# Patient Record
Sex: Female | Born: 1940 | Race: White | Hispanic: No | Marital: Married | Smoking: Former smoker
Health system: Southern US, Community
[De-identification: ages and names within clinical notes are randomized; demographics above are authoritative.]

## PROBLEM LIST (undated history)

## (undated) DIAGNOSIS — M25512 Pain in left shoulder: Secondary | ICD-10-CM

## (undated) DIAGNOSIS — R001 Bradycardia, unspecified: Secondary | ICD-10-CM

## (undated) DIAGNOSIS — G459 Transient cerebral ischemic attack, unspecified: Secondary | ICD-10-CM

## (undated) DIAGNOSIS — E782 Mixed hyperlipidemia: Secondary | ICD-10-CM

## (undated) DIAGNOSIS — I779 Disorder of arteries and arterioles, unspecified: Secondary | ICD-10-CM

## (undated) DIAGNOSIS — I2510 Atherosclerotic heart disease of native coronary artery without angina pectoris: Secondary | ICD-10-CM

## (undated) DIAGNOSIS — I739 Peripheral vascular disease, unspecified: Secondary | ICD-10-CM

## (undated) DIAGNOSIS — I10 Essential (primary) hypertension: Secondary | ICD-10-CM

## (undated) DIAGNOSIS — I701 Atherosclerosis of renal artery: Secondary | ICD-10-CM

## (undated) HISTORY — DX: Bradycardia, unspecified: R00.1

## (undated) HISTORY — DX: Disorder of arteries and arterioles, unspecified: I77.9

## (undated) HISTORY — PX: CARDIAC CATHETERIZATION: SHX172

## (undated) HISTORY — DX: Peripheral vascular disease, unspecified: I73.9

## (undated) HISTORY — DX: Atherosclerotic heart disease of native coronary artery without angina pectoris: I25.10

## (undated) HISTORY — DX: Transient cerebral ischemic attack, unspecified: G45.9

## (undated) HISTORY — DX: Mixed hyperlipidemia: E78.2

## (undated) HISTORY — DX: Pain in left shoulder: M25.512

## (undated) HISTORY — PX: KNEE SURGERY: SHX244

## (undated) HISTORY — DX: Essential (primary) hypertension: I10

## (undated) HISTORY — PX: TONSILLECTOMY AND ADENOIDECTOMY: SUR1326

## (undated) HISTORY — PX: CORONARY ANGIOPLASTY WITH STENT PLACEMENT: SHX49

## (undated) HISTORY — DX: Atherosclerosis of renal artery: I70.1

---
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
## 1962-04-26 HISTORY — PX: DILATION AND CURETTAGE OF UTERUS: SHX78

## 2001-03-26 HISTORY — PX: CORONARY ARTERY BYPASS GRAFT: SHX141

## 2001-03-31 ENCOUNTER — Inpatient Hospital Stay (HOSPITAL_COMMUNITY): Admission: AD | Admit: 2001-03-31 | Discharge: 2001-04-14 | Payer: Self-pay | Admitting: Cardiovascular Disease

## 2001-04-04 ENCOUNTER — Encounter: Payer: Self-pay | Admitting: Cardiothoracic Surgery

## 2001-04-06 ENCOUNTER — Encounter: Payer: Self-pay | Admitting: Cardiothoracic Surgery

## 2001-04-07 ENCOUNTER — Encounter: Payer: Self-pay | Admitting: Cardiothoracic Surgery

## 2001-04-08 ENCOUNTER — Encounter: Payer: Self-pay | Admitting: Cardiothoracic Surgery

## 2001-04-09 ENCOUNTER — Encounter: Payer: Self-pay | Admitting: Cardiothoracic Surgery

## 2001-04-10 ENCOUNTER — Encounter: Payer: Self-pay | Admitting: Cardiothoracic Surgery

## 2001-04-11 ENCOUNTER — Encounter: Payer: Self-pay | Admitting: Cardiothoracic Surgery

## 2001-04-12 ENCOUNTER — Encounter: Payer: Self-pay | Admitting: Cardiothoracic Surgery

## 2001-12-30 ENCOUNTER — Encounter: Payer: Self-pay | Admitting: Cardiology

## 2001-12-30 ENCOUNTER — Inpatient Hospital Stay (HOSPITAL_COMMUNITY): Admission: AD | Admit: 2001-12-30 | Discharge: 2002-01-04 | Payer: Self-pay | Admitting: Cardiology

## 2002-03-27 ENCOUNTER — Inpatient Hospital Stay (HOSPITAL_COMMUNITY): Admission: AD | Admit: 2002-03-27 | Discharge: 2002-03-30 | Payer: Self-pay | Admitting: Cardiology

## 2004-06-15 ENCOUNTER — Ambulatory Visit: Payer: Self-pay | Admitting: Cardiology

## 2004-06-16 ENCOUNTER — Ambulatory Visit: Payer: Self-pay | Admitting: Cardiology

## 2004-07-06 ENCOUNTER — Ambulatory Visit: Payer: Self-pay | Admitting: Cardiology

## 2004-08-13 ENCOUNTER — Ambulatory Visit: Payer: Self-pay | Admitting: Cardiology

## 2004-09-26 ENCOUNTER — Ambulatory Visit: Payer: Self-pay | Admitting: Internal Medicine

## 2004-09-27 ENCOUNTER — Observation Stay (HOSPITAL_COMMUNITY): Admission: EM | Admit: 2004-09-27 | Discharge: 2004-09-27 | Payer: Self-pay | Admitting: Emergency Medicine

## 2004-09-29 ENCOUNTER — Ambulatory Visit: Payer: Self-pay

## 2004-11-02 ENCOUNTER — Ambulatory Visit: Payer: Self-pay | Admitting: Cardiology

## 2005-06-15 ENCOUNTER — Ambulatory Visit: Payer: Self-pay | Admitting: Cardiology

## 2005-07-09 ENCOUNTER — Ambulatory Visit: Payer: Self-pay | Admitting: Cardiology

## 2005-09-14 ENCOUNTER — Ambulatory Visit: Payer: Self-pay | Admitting: Cardiology

## 2006-02-11 ENCOUNTER — Ambulatory Visit: Payer: Self-pay | Admitting: Cardiology

## 2006-02-14 ENCOUNTER — Ambulatory Visit: Payer: Self-pay | Admitting: Cardiology

## 2006-06-08 ENCOUNTER — Ambulatory Visit: Payer: Self-pay | Admitting: Cardiology

## 2006-06-16 ENCOUNTER — Ambulatory Visit: Payer: Self-pay | Admitting: Cardiology

## 2006-06-17 ENCOUNTER — Inpatient Hospital Stay (HOSPITAL_BASED_OUTPATIENT_CLINIC_OR_DEPARTMENT_OTHER): Admission: RE | Admit: 2006-06-17 | Discharge: 2006-06-17 | Payer: Self-pay | Admitting: Cardiology

## 2006-06-17 ENCOUNTER — Ambulatory Visit: Payer: Self-pay | Admitting: Cardiology

## 2006-07-04 ENCOUNTER — Ambulatory Visit: Payer: Self-pay | Admitting: Cardiology

## 2006-07-25 ENCOUNTER — Ambulatory Visit: Payer: Self-pay | Admitting: Cardiology

## 2006-09-20 ENCOUNTER — Encounter: Payer: Self-pay | Admitting: Cardiology

## 2006-09-20 ENCOUNTER — Ambulatory Visit: Payer: Self-pay | Admitting: Cardiology

## 2006-09-21 ENCOUNTER — Ambulatory Visit (HOSPITAL_COMMUNITY): Admission: RE | Admit: 2006-09-21 | Discharge: 2006-09-21 | Payer: Self-pay | Admitting: Cardiology

## 2006-09-21 ENCOUNTER — Ambulatory Visit: Payer: Self-pay | Admitting: Cardiology

## 2006-10-17 ENCOUNTER — Ambulatory Visit: Payer: Self-pay | Admitting: Physician Assistant

## 2007-01-13 ENCOUNTER — Encounter: Payer: Self-pay | Admitting: Cardiology

## 2007-03-03 ENCOUNTER — Ambulatory Visit: Payer: Self-pay | Admitting: Cardiology

## 2007-04-25 ENCOUNTER — Encounter: Payer: Self-pay | Admitting: Physician Assistant

## 2007-07-31 ENCOUNTER — Encounter: Payer: Self-pay | Admitting: Cardiology

## 2007-07-31 ENCOUNTER — Ambulatory Visit: Payer: Self-pay | Admitting: Cardiology

## 2007-08-25 ENCOUNTER — Encounter: Payer: Self-pay | Admitting: Physician Assistant

## 2007-09-05 ENCOUNTER — Ambulatory Visit: Payer: Self-pay | Admitting: Cardiology

## 2007-10-10 ENCOUNTER — Ambulatory Visit: Payer: Self-pay | Admitting: Cardiology

## 2007-11-28 ENCOUNTER — Ambulatory Visit: Payer: Self-pay | Admitting: Cardiology

## 2008-08-05 ENCOUNTER — Ambulatory Visit: Payer: Self-pay | Admitting: Cardiology

## 2008-08-06 ENCOUNTER — Encounter: Payer: Self-pay | Admitting: Cardiology

## 2008-08-06 ENCOUNTER — Ambulatory Visit: Payer: Self-pay | Admitting: Cardiology

## 2009-02-13 ENCOUNTER — Encounter: Payer: Self-pay | Admitting: Cardiology

## 2009-03-03 ENCOUNTER — Encounter: Payer: Self-pay | Admitting: Cardiology

## 2009-03-27 ENCOUNTER — Encounter (INDEPENDENT_AMBULATORY_CARE_PROVIDER_SITE_OTHER): Payer: Self-pay | Admitting: *Deleted

## 2009-03-28 ENCOUNTER — Ambulatory Visit: Payer: Self-pay | Admitting: Cardiology

## 2009-09-29 ENCOUNTER — Encounter: Payer: Self-pay | Admitting: Cardiology

## 2009-10-14 ENCOUNTER — Ambulatory Visit: Payer: Self-pay | Admitting: Cardiology

## 2009-11-24 ENCOUNTER — Encounter: Payer: Self-pay | Admitting: Cardiology

## 2010-01-19 ENCOUNTER — Telehealth (INDEPENDENT_AMBULATORY_CARE_PROVIDER_SITE_OTHER): Payer: Self-pay | Admitting: *Deleted

## 2010-01-23 ENCOUNTER — Telehealth (INDEPENDENT_AMBULATORY_CARE_PROVIDER_SITE_OTHER): Payer: Self-pay | Admitting: *Deleted

## 2010-01-27 ENCOUNTER — Encounter: Payer: Self-pay | Admitting: Cardiology

## 2010-01-28 ENCOUNTER — Encounter: Payer: Self-pay | Admitting: Cardiology

## 2010-03-16 ENCOUNTER — Encounter: Payer: Self-pay | Admitting: Cardiology

## 2010-03-23 ENCOUNTER — Encounter: Payer: Self-pay | Admitting: Cardiology

## 2010-04-01 ENCOUNTER — Ambulatory Visit: Payer: Self-pay | Admitting: Cardiology

## 2010-04-01 ENCOUNTER — Encounter: Payer: Self-pay | Admitting: Cardiology

## 2010-04-26 HISTORY — PX: ILIAC ARTERY STENT: SHX1786

## 2010-05-26 NOTE — Assessment & Plan Note (Signed)
Summary: 6 mof u per dec reminder   Visit Type:  Follow-up Primary Provider:  Beatrix Fetters Shah,MD  CC:  CAD.  History of Present Illness: Patient is seen for cardiology followup.  She feels great.  I saw her 6 months ago.  She has documented coronary disease.  She underwent CABG in the past.  Her last catheter was done in May, 2008.  LIMA was atretic.  There was question of 60% left main.  If she has symptoms we will recatheterize the left main.  She had a Cardiolite in April, 2010.  There was no ischemia.  Ejection fraction is normal.  Preventive Screening-Counseling & Management  Alcohol-Tobacco     Smoking Status: quit     Year Quit: 2002  Current Medications (verified): 1)  Plavix 75 Mg Tabs (Clopidogrel Bisulfate) .... Take 1 Tablet By Mouth Once A Day 2)  Aspirin 81 Mg Tbec (Aspirin) .... Take One Tablet By Mouth Daily 3)  Fish Oil 1000 Mg Caps (Omega-3 Fatty Acids) .... Take 2 Capsules By Mouth Two Times A Day 4)  Amlodipine Besylate 5 Mg Tabs (Amlodipine Besylate) .... Take One Tablet By Mouth Daily 5)  Isosorbide Mononitrate Cr 60 Mg Xr24h-Tab (Isosorbide Mononitrate) .... Take 1 Tablet By Mouth Once A Day 6)  Neurontin 100 Mg Caps (Gabapentin) .... Take 1 Tab By Mouth At Bedtime 7)  Metoprolol Succinate 50 Mg Xr24h-Tab (Metoprolol Succinate) .... Take 1&1/2 Tablet By Mouth Daily 8)  Imipramine Hcl 25 Mg Tabs (Imipramine Hcl) .... Take 1 Tablet By Mouth Once A Day 9)  Nitrostat 0.4 Mg Subl (Nitroglycerin) .... Use As Directed 10)  Vitamin D 1000 Unit Tabs (Cholecalciferol) .... Take 1 Tablet By Mouth Once A Day 11)  Lipitor 20 Mg Tabs (Atorvastatin Calcium) .... Take One Tablet By Mouth Daily.  Allergies (verified): 1)  Sulfa  Comments:  Nurse/Medical Assistant: The patient's medication list and allergies were reviewed with the patient and were updated in the Medication and Allergy Lists.  Past History:  Past Medical History: HYPERTENSION, UNSPECIFIED  (ICD-401.9) HYPERLIPIDEMIA-MIXED (ICD-272.4) CABG CAD...cath May, 2008.Marland KitchenMarland KitchenLIMA atretic.Marland Kitchen60% L Main.Marland KitchenMarland KitchenIf problems, recath with IVUS to assess L Main  /  Cardiolite  April, 2010...no ischemia EF 70%...nuclear scan...  07/2008 Bradycardia...sinus...assymptomatic PAD COPD TIA...Marland Kitchenremote..    Review of Systems       Patient denies fever, chills, headache, sweats, rash, change in vision, change in hearing, chest pain, cough, nausea vomiting, urinary symptoms.  All other systems are reviewed and are negative.  Vital Signs:  Patient profile:   70 year old female Height:      61.5 inches Weight:      145 pounds BMI:     27.05 Pulse rate:   66 / minute BP sitting:   125 / 80  (left arm) Cuff size:   regular  Vitals Entered By: Carlye Grippe (April 01, 2010 10:20 AM)  Nutrition Counseling: Patient's BMI is greater than 25 and therefore counseled on weight management options.  Physical Exam  General:  patient looks great. Head:  head is atraumatic. Eyes:  no xanthelasma. Neck:  no jugular venous distention. Chest Wall:  no chest wall tenderness. Lungs:  lungs are clear cardiorespiratory effort is nonlabored. Heart:  cardiac exam reveals S1-S2.  No clicks or significant murmurs. Abdomen:  abdomen soft. Msk:  no musculoskeletal deformities. Extremities:  no peripheral edema. Skin:  no skin rashes. Psych:  patient is oriented to person time and place.  Affect is normal.   Impression &  Recommendations:  Problem # 1:  TIA (ICD-435.9) The patient has had no symptoms of a TIA.  No further workup.  Problem # 2:  BRADYCARDIA (ICD-427.89) Heart rate today is 64.  She's not having any symptoms of bradycardia.  No further workup.  Problem # 3:  CAD (ICD-414.00) Coronary disease is stable.  EKG is done today and reviewed by me.  She has old inverted T waves in V1 and V2.  There is no significant change.  No further workup needed.  We may consider stress test in 6 months and one year.   It is documented that she does have left main disease.  Problem # 4:  HYPERTENSION, UNSPECIFIED (ICD-401.9) Blood pressure is well-controlled.  No change in therapy.  Problem # 5:  HYPERLIPIDEMIA-MIXED (ICD-272.4)  Her updated medication list for this problem includes:    Lipitor 40 Mg Tabs (Atorvastatin calcium) .Marland Kitchen... Take one tablet by mouth at bedtime. The patient's most recent LDL was 90 on 20 of Lipitor.  She is tolerating this medicine very well.  I will try to push up to 40 mg daily.  She asked about red yeast rice.  I would not recommend that as she is currently taking a statin  Other Orders: EKG w/ Interpretation (93000)  Patient Instructions: 1)  Your physician wants you to follow-up in: 6 months. You will receive a reminder letter in the mail one-two months in advance. If you don't receive a letter, please call our office to schedule the follow-up appointment. 2)  Increase Lipitor to 40mg  by mouth at bedtime. Prescriptions: LIPITOR 40 MG TABS (ATORVASTATIN CALCIUM) Take one tablet by mouth at bedtime.  #30 x 6   Entered by:   Cyril Loosen, RN, BSN   Authorized by:   Talitha Givens, MD, [facility]   Signed by:   Cyril Loosen, RN, BSN on 04/01/2010   Method used:   Electronically to        [facility]* (retail)       [address]       [place], [place]  [address]       Ph: [phone]       Fax: [phone]   RxID:   [id number]

## 2010-05-26 NOTE — Medication Information (Signed)
Summary: RX Folder/ FAXED AUTHORIZATION REQUEST LIPITOR  RX Folder/ FAXED AUTHORIZATION REQUEST LIPITOR   Imported By: Dorise Hiss 01/28/2010 10:29:47  _____________________________________________________________________  External Attachment:    Type:   Image     Comment:   External Document

## 2010-05-26 NOTE — Assessment & Plan Note (Signed)
Summary: 6 month fu-recv letter vs   Visit Type:  Follow-up Primary Provider:  Kirstie Peri  CC:  CAD.  History of Present Illness: Patient is seen for followup of coronary disease.  She is doing very well.  She did not have any chest pain.  She is not having any of the throat discomfort that sometimes concerns [place] as an anginal equivalent.  She is active.  Preventive Screening-Counseling & Management  Alcohol-Tobacco     Smoking Status: quit     Year Quit: 2002  Current Medications (verified): 1)  Plavix 75 Mg Tabs (Clopidogrel Bisulfate) .... Take 1 Tablet By Mouth Once A Day 2)  Aspirin 81 Mg Tbec (Aspirin) .... Take One Tablet By Mouth Daily 3)  Fish Oil 1000 Mg Caps (Omega-3 Fatty Acids) .... Take 2 Capsules By Mouth Two Times A Day 4)  Amlodipine Besylate 5 Mg Tabs (Amlodipine Besylate) .... Take One Tablet By Mouth Daily 5)  Isosorbide Mononitrate Cr 60 Mg Xr24h-Tab (Isosorbide Mononitrate) .... Take 1 Tablet By Mouth Once A Day 6)  Neurontin 100 Mg Caps (Gabapentin) .... Take 1 Tab By Mouth At Bedtime 7)  Metoprolol Succinate 50 Mg Xr24h-Tab (Metoprolol Succinate) .... Take 1&1/2 Tablet By Mouth Daily 8)  Simvastatin 40 Mg Tabs (Simvastatin) .... Take One Tablet By Mouth Daily At Bedtime 9)  Imipramine Hcl 10 Mg Tabs (Imipramine Hcl) .... Take 1 Tab By Mouth At Bedtime 10)  Nitrostat 0.4 Mg Subl (Nitroglycerin) .... Use As Directed 11)  Vitamin D 1000 Unit Tabs (Cholecalciferol) .... Take 1 Tablet By Mouth Once A Day  Allergies (verified): 1)  Sulfa  Comments:  Nurse/Medical Assistant: The patient's medications and allergies were verbally reviewed with the patient and were updated in the Medication and Allergy Lists.  Past History:  Past Medical History: HYPERTENSION, UNSPECIFIED (ICD-401.9) HYPERLIPIDEMIA-MIXED (ICD-272.4) CABG CAD...cath May, 2008.Marland KitchenMarland KitchenLIMA atretic.Marland Kitchen60% L Main.Marland KitchenMarland KitchenIf problems, recath with IVUS to assess L Main  /  Cardiolite  April, 2010...no  ischemia EF 70%...nuclear scan...  07/2008 Bradycardia...sinus...assymptomatic PAD COPD TIA...Marland Kitchenremote    Review of Systems       Patient denies fever, chills, headache, sweats, rash, change in vision, change in hearing, chest pain, cough, nausea vomiting, urinary symptoms.  Vital Signs:  Patient profile:   70 year old female Height:      61.5 inches Weight:      144 pounds Pulse rate:   73 / minute BP sitting:   117 / 74  (left arm) Cuff size:   regular  Vitals Entered By: Carlye Grippe (October 14, 2009 2:06 PM)  Physical Exam  General:  patient is stable but overweight. Eyes:  no xanthelasma. Neck:  no jugulovenous stent in. Lungs:  lungs are clear.  Respiratory effort is nonlabored. Heart:  cardiac exam reveals S1-S2.  No clicks or significant murmurs. Abdomen:  abdomen is soft. Extremities:  no peripheral edema. Psych:  patient oriented to person time and place.  Affect is normal.   Impression & Recommendations:  Problem # 1:  BRADYCARDIA (ICD-427.89) Patient is not having any significant bradycardias.  No further workup.  Problem # 2:  CAD (ICD-414.00) Coronary disease is stable.  She's not having any chest pain or throat pain.  No further workup.  Problem # 3:  HYPERTENSION, UNSPECIFIED (ICD-401.9)  Her updated medication list for this problem includes:    Aspirin 81 Mg Tbec (Aspirin) .Marland Kitchen... Take one tablet by mouth daily    Amlodipine Besylate 5 Mg Tabs (Amlodipine besylate) .Marland Kitchen... Take  one tablet by mouth daily    Metoprolol Succinate 50 Mg Xr24h-tab (Metoprolol succinate) .Marland Kitchen... Take 1&1/2 tablet by mouth daily Blood pressure meds have been adjusted recently by Dr.Shah.  Blood pressure is nicely controlled today.  No change in therapy.  Problem # 4:  HYPERLIPIDEMIA-MIXED (ICD-272.4)  Her updated medication list for this problem includes:    Simvastatin 20 Mg Tabs (Simvastatin) .Marland Kitchen... Take one tablet by mouth daily at bedtime Recent FDA recommendations suggest  that the patient is on amlodipine that simvastatin be used at a maximum of 20 mg.  Her simvastatin dose will be decreased to 20 mg.  She will have a lipid followup over time.  We will change her to a different medication if needed  Future Orders: T-Lipid Profile [id number]) ... 11/24/2009 T-Hepatic Function [id number]) ... 11/24/2009  Patient Instructions: 1)  Your physician wants you to follow-up in: 6 months. You will receive a reminder letter in the mail one-two months in advance. If you don't receive a letter, please call our office to schedule the follow-up appointment. 2)  Decrease Simvastatin to 20mg  by mouth at bedtime. You may take 1/2 of your 40mg  tablet. 3)  Your physician recommends that you go to the [facility] for a FASTING lipid profile and liver function labs. Do lab work in 6 wks-around August 1st. Do not eat or drink after midnight. If the results of your test are normal or stable, you will receive a letter. If they are abnormal, the nurse will contact you by phone.  Prescriptions: SIMVASTATIN 20 MG TABS (SIMVASTATIN) Take one tablet by mouth daily at bedtime  #30 x 6   Entered by:   Cyril Loosen, RN, BSN   Authorized by:   Talitha Givens, MD, [facility]   Signed by:   Cyril Loosen, RN, BSN on 10/14/2009   Method used:   Electronically to        [facility]* (retail)       [address]       [place], [place]  [address]       Ph: [phone]       Fax: [phone]   RxID:   [id number]

## 2010-05-26 NOTE — Progress Notes (Signed)
Summary: Lipitor Prior-auth  Phone Note Call from Patient Call back at [phone]   Summary of Call: Pt states she came to p/u Lipitor but was told by [employer] that they sent [place] a prior-auth. Spoke with [employer] and notified them we did not receive this. They will resend. Pt verbalized understanding. She is at the [facility] and will p/u when she returns to town. Initial call taken by: Cyril Loosen, RN, BSN,  January 23, 2010 1:55 PM     Appended Document: Lipitor Prior-auth Dr. Margaretmary Eddy office called to ask if we would do prior auth or if they needed to. Left message to notify them that our office would handle this.

## 2010-05-26 NOTE — Medication Information (Signed)
Summary: RX Folder/ LIPITOR APPROVED  RX Folder/ LIPITOR APPROVED   Imported By: Dorise Hiss 01/28/2010 15:32:19  _____________________________________________________________________  External Attachment:    Type:   Image     Comment:   External Document

## 2010-05-26 NOTE — Progress Notes (Signed)
Summary: Stopped Pravastatin  Phone Note Call from Patient   Summary of Call: Pt called stating she saw took Pravastatin as discussed following recent labs. She states she lost her taste and became nauseated. She states she [place] she left message with our office about this but got a call back from the [facility] office stating she had left message about changing an appt. Pt states she had previously scheduled appt with Dr. Sherryll Burger. He told her to stay off Pravastatin for 2 wks. If her symptoms resolved, he or we could call in prescription for Lipitor to replace Pravastatin. Pt states it has been 2 weeks and her taste has improved. She would like to try Lipitor at this point. Initial call taken by: Cyril Loosen, RN, BSN,  January 19, 2010 8:48 AM    New/Updated Medications: LIPITOR 20 MG TABS (ATORVASTATIN CALCIUM) Take one tablet by mouth daily. Prescriptions: LIPITOR 20 MG TABS (ATORVASTATIN CALCIUM) Take one tablet by mouth daily.  #30 x 6   Entered by:   Cyril Loosen, RN, BSN   Authorized by:   Talitha Givens, MD, [facility]   Signed by:   Cyril Loosen, RN, BSN on 01/19/2010   Method used:   Electronically to        [facility]* (retail)       [address]       [place], [place]  [address]       Ph: [phone]       Fax: [phone]   RxID:   [id number]

## 2010-05-26 NOTE — Miscellaneous (Signed)
Summary: Orders Update  Clinical Lists Changes  Orders: Added new Test order of T-Lipid Profile ([id number]) - Signed Added new Test order of T-Hepatic Function ([id number]) - Signed 

## 2010-06-02 ENCOUNTER — Inpatient Hospital Stay (HOSPITAL_COMMUNITY)
Admission: AD | Admit: 2010-06-02 | Discharge: 2010-06-03 | Disposition: A | Discharge status: Home / Self Care | Payer: Medicare Other | Source: Other Acute Inpatient Hospital | Attending: Cardiology | Admitting: Cardiology

## 2010-06-02 DIAGNOSIS — I739 Peripheral vascular disease, unspecified: Secondary | ICD-10-CM | POA: Diagnosis present

## 2010-06-02 DIAGNOSIS — I701 Atherosclerosis of renal artery: Secondary | ICD-10-CM | POA: Diagnosis present

## 2010-06-02 DIAGNOSIS — J449 Chronic obstructive pulmonary disease, unspecified: Secondary | ICD-10-CM | POA: Diagnosis present

## 2010-06-02 DIAGNOSIS — I10 Essential (primary) hypertension: Secondary | ICD-10-CM | POA: Diagnosis present

## 2010-06-02 DIAGNOSIS — Z8673 Personal history of transient ischemic attack (TIA), and cerebral infarction without residual deficits: Secondary | ICD-10-CM

## 2010-06-02 DIAGNOSIS — J4489 Other specified chronic obstructive pulmonary disease: Secondary | ICD-10-CM | POA: Diagnosis present

## 2010-06-02 DIAGNOSIS — IMO0002 Reserved for concepts with insufficient information to code with codable children: Secondary | ICD-10-CM | POA: Diagnosis present

## 2010-06-02 DIAGNOSIS — I248 Other forms of acute ischemic heart disease: Secondary | ICD-10-CM

## 2010-06-02 DIAGNOSIS — I774 Celiac artery compression syndrome: Secondary | ICD-10-CM | POA: Diagnosis present

## 2010-06-02 DIAGNOSIS — I2510 Atherosclerotic heart disease of native coronary artery without angina pectoris: Secondary | ICD-10-CM | POA: Diagnosis present

## 2010-06-02 DIAGNOSIS — Y840 Cardiac catheterization as the cause of abnormal reaction of the patient, or of later complication, without mention of misadventure at the time of the procedure: Secondary | ICD-10-CM | POA: Diagnosis present

## 2010-06-02 DIAGNOSIS — M7989 Other specified soft tissue disorders: Secondary | ICD-10-CM | POA: Diagnosis present

## 2010-06-02 DIAGNOSIS — K219 Gastro-esophageal reflux disease without esophagitis: Secondary | ICD-10-CM | POA: Diagnosis present

## 2010-06-02 DIAGNOSIS — R070 Pain in throat: Secondary | ICD-10-CM

## 2010-06-02 DIAGNOSIS — Z7982 Long term (current) use of aspirin: Secondary | ICD-10-CM

## 2010-06-02 DIAGNOSIS — D62 Acute posthemorrhagic anemia: Secondary | ICD-10-CM | POA: Diagnosis present

## 2010-06-02 DIAGNOSIS — Z951 Presence of aortocoronary bypass graft: Secondary | ICD-10-CM

## 2010-06-02 DIAGNOSIS — T85890A Other specified complication of nervous system prosthetic devices, implants and grafts, initial encounter: Secondary | ICD-10-CM | POA: Diagnosis present

## 2010-06-02 DIAGNOSIS — Z79899 Other long term (current) drug therapy: Secondary | ICD-10-CM

## 2010-06-02 DIAGNOSIS — E785 Hyperlipidemia, unspecified: Secondary | ICD-10-CM | POA: Diagnosis present

## 2010-06-03 ENCOUNTER — Telehealth (INDEPENDENT_AMBULATORY_CARE_PROVIDER_SITE_OTHER): Payer: Self-pay | Admitting: *Deleted

## 2010-06-03 ENCOUNTER — Emergency Department (HOSPITAL_COMMUNITY)
Admission: EM | Admit: 2010-06-03 | Discharge: 2010-06-03 | Disposition: A | Discharge status: Other Acute Inpatient Hospital | Payer: Medicare Other | Source: Home / Self Care | Attending: Emergency Medicine | Admitting: Emergency Medicine

## 2010-06-03 ENCOUNTER — Inpatient Hospital Stay (HOSPITAL_COMMUNITY)
Admission: AD | Admit: 2010-06-03 | Discharge: 2010-06-05 | DRG: 920 | Disposition: A | Discharge status: Home / Self Care | Payer: Medicare Other | Source: Other Acute Inpatient Hospital | Attending: Cardiology | Admitting: Cardiology

## 2010-06-03 DIAGNOSIS — M7989 Other specified soft tissue disorders: Secondary | ICD-10-CM | POA: Insufficient documentation

## 2010-06-03 DIAGNOSIS — R079 Chest pain, unspecified: Secondary | ICD-10-CM

## 2010-06-03 DIAGNOSIS — Y840 Cardiac catheterization as the cause of abnormal reaction of the patient, or of later complication, without mention of misadventure at the time of the procedure: Secondary | ICD-10-CM | POA: Insufficient documentation

## 2010-06-03 DIAGNOSIS — Z79899 Other long term (current) drug therapy: Secondary | ICD-10-CM | POA: Insufficient documentation

## 2010-06-03 DIAGNOSIS — IMO0002 Reserved for concepts with insufficient information to code with codable children: Secondary | ICD-10-CM | POA: Insufficient documentation

## 2010-06-03 DIAGNOSIS — I2510 Atherosclerotic heart disease of native coronary artery without angina pectoris: Secondary | ICD-10-CM | POA: Insufficient documentation

## 2010-06-03 LAB — COMPREHENSIVE METABOLIC PANEL
ALT: 22 U/L (ref 0–35)
AST: 20 U/L (ref 0–37)
Albumin: 3.2 g/dL — ABNORMAL LOW (ref 3.5–5.2)
GFR calc non Af Amer: 52 mL/min — ABNORMAL LOW (ref >60)

## 2010-06-03 LAB — CBC
Hemoglobin: 12.1 g/dL (ref 12.0–15.0)
MCH: 30.6 pg (ref 26.0–34.0)
MCV: 91.9 fL (ref 78.0–100.0)
RDW: 12.8 % (ref 11.5–15.5)

## 2010-06-04 ENCOUNTER — Encounter: Payer: Self-pay | Admitting: Cardiology

## 2010-06-04 DIAGNOSIS — M79609 Pain in unspecified limb: Secondary | ICD-10-CM

## 2010-06-04 DIAGNOSIS — I2510 Atherosclerotic heart disease of native coronary artery without angina pectoris: Secondary | ICD-10-CM

## 2010-06-04 LAB — CBC
HCT: 33.3 % — ABNORMAL LOW (ref 36.0–46.0)
Hemoglobin: 11.1 g/dL — ABNORMAL LOW (ref 12.0–15.0)
Hemoglobin: 11.7 g/dL — ABNORMAL LOW (ref 12.0–15.0)
MCH: 31 pg (ref 26.0–34.0)
MCHC: 33.3 g/dL (ref 30.0–36.0)
MCHC: 34 g/dL (ref 30.0–36.0)
MCV: 92 fL (ref 78.0–100.0)
MCV: 93 fL (ref 78.0–100.0)
Platelets: 203 10*3/uL (ref 150–400)
RBC: 3.58 MIL/uL — ABNORMAL LOW (ref 3.87–5.11)
RDW: 12.9 % (ref 11.5–15.5)
WBC: 8.5 10*3/uL (ref 4.0–10.5)

## 2010-06-05 ENCOUNTER — Encounter: Payer: Self-pay | Admitting: Cardiology

## 2010-06-05 ENCOUNTER — Telehealth (INDEPENDENT_AMBULATORY_CARE_PROVIDER_SITE_OTHER): Payer: Self-pay | Admitting: *Deleted

## 2010-06-05 LAB — CBC
Hemoglobin: 10 g/dL — ABNORMAL LOW (ref 12.0–15.0)
MCH: 30.2 pg (ref 26.0–34.0)
MCV: 92.1 fL (ref 78.0–100.0)
RBC: 3.31 MIL/uL — ABNORMAL LOW (ref 3.87–5.11)

## 2010-06-10 NOTE — Discharge Summary (Signed)
NAME:  Linda [name], Linda [name] NO.:  [id number]  MEDICAL RECORD NO.:  [id number]           PATIENT TYPE:  I  LOCATION:  3741                         FACILITY:  [facility]  PHYSICIAN:  Gerianne Simonet M. [place], M.D.  DATE OF BIRTH:  1940/08/04  DATE OF ADMISSION:  06/02/2010 DATE OF DISCHARGE:  06/03/2010                              DISCHARGE SUMMARY   PRIMARY CARDIOLOGIST:  Learta Codding, MD, [facility]  PRIMARY CARE PROVIDER:  Kirstie Peri, MD  DISCHARGE DIAGNOSIS:  Chest pain without objective evidence of ischemia.  SECONDARY DIAGNOSES: 1. Coronary artery disease status post prior coronary artery bypass     grafting with 2-3 patent grafts by catheterization this admission. 2. Hypertension. 3. Peripheral vascular disease with right renal artery stenosis and     right common iliac stenosis. 4. Hyperlipidemia. 5. Chronic obstructive pulmonary disease. 6. Remote tobacco abuse. 7. History of transient ischemic attack. 8. Gastroesophageal reflux disease. 9. History of asymptomatic sinus bradycardia.  ALLERGIES:  MACRODANTIN.  PROCEDURE:  Left heart cardiac catheterization revealing patent stent in the LAD, patent vein graft to the RCA, patent vein graft to the obtuse marginal along with approximately 30% in-stent restenosis of the ostium of the graft.  The LIMA and native RCA are known to be occluded by prior caths.  EF was 65%.  HISTORY OF PRESENT ILLNESS:  A 70 year old female with prior history of coronary artery disease status post coronary artery bypass grafting who presented to [facility] in [place] on February 6 with complaints of chest discomfort similar to prior angina.  She was seen by [facility] [place] and transferred [facility] for further evaluation.  HOSPITAL COURSE:  The patient's cardiac enzymes were negative at [facility] and upon arrival to [facility], the patient was set up for catheterization.  She underwent diagnostic catheterization  on February 7 showing 2-3 patent grafts with a known occlusion of LIMA to the LAD.  The stent within the native LAD was patent as was the stent in the vein graft to the obtuse marginal.  The circumflex and right coronary artery were occluded.  There was no change in anatomy compared to catheterization in May 2008 and therefore, continued medical therapy was warranted.  This morning the patient has been ambulating without recurrent symptoms or limitations and will be discharged home today in good condition.  DISCHARGE LABORATORY DATA:  Hemoglobin 12.1, hematocrit 36.3, WBC is 7.7, and platelets 221.  Sodium 140, potassium 3.6, chloride 102, CO2 of 27, BUN 20, creatinine 1.06, and glucose 122.  Total bilirubin 0.6, alkaline phosphatase 40, AST 20, ALT 22, total protein 6.5, albumin 3.2, and calcium 8.9.  DISPOSITION:  The patient will be discharged home today in good condition.  FOLLOWUP PLANS AND APPOINTMENTS:  We will arrange follow up with Dr. Andee Lineman in few weeks and with Dr. Sherryll Burger as scheduled.  DISCHARGE MEDICATIONS: 1. Aspirin 81 mg daily. 2. Fish oil 1000 mg 2 caps b.i.d. 3. Imipramine 25 mg at bedtime p.r.n. 4. Imdur 60 mg daily. 5. Lipitor 40 mg at bedtime. 6. Neurontin 100 mg at bedtime. 7. Nitroglycerin 0.4 mg  sublingual p.r.n. chest pain. 8. Norvasc 5 mg daily. 9. Plavix 75 mg daily. 10.Toprol-XL 50 mg one and half tablet daily. 11.Vitamin D3, 2000 units daily. 12.Xanax 0.25 mg at bedtime p.r.n.  OUTSTANDING LABORATORY STUDIES:  None.  DURATION OF DISCHARGE ENCOUNTER:  40 minutes including physician time.     Linda [name], ANP   ______________________________ Linda [name] M. [place], M.D.    CB/MEDQ  D:  06/03/2010  T:  06/04/2010  Job:  [id number]  cc:   Kirstie Peri, MD  Electronically Signed by Linda [name] ANP on 06/08/2010 12:06:11 PM Electronically Signed by Mychelle Kendra [place] M.D. on 06/10/2010 11:17:05 AM

## 2010-06-10 NOTE — Discharge Summary (Signed)
NAME:  Linda [name], Linda [name] NO.:  [id number]  MEDICAL RECORD NO.:  [id number]           PATIENT TYPE:  I  LOCATION:  3743                         FACILITY:  [facility]  PHYSICIAN:  Peter M. [place]iland, M.D.  DATE OF BIRTH:  03-05-1941  DATE OF ADMISSION:  06/03/2010 DATE OF DISCHARGE:  06/05/2010                              DISCHARGE SUMMARY   PRIMARY CARDIOLOGIST:  Learta Codding, MD, [facility]  PRIMARY CARE PROVIDER:  Kirstie Peri, MD  DISCHARGE DIAGNOSES: 1. Left groin hematoma secondary to cardiac catheterization, no     pseudoaneurysm noted. 2. Anemia of acute blood loss, secondary to #1. 3. Coronary artery disease status post coronary artery bypass grafting     with 2/3 patent grafts by cardiac catheterization on June 02, 2010.  SECONDARY DIAGNOSES: 1. Hypertension. 2. Peripheral vascular disease with right renal artery stenosis and     right common celiac stenosis. 3. Hyperlipidemia. 4. Chronic obstructive pulmonary disease. 5. Remote tobacco abuse. 6. History of transient ischemic attack. 7. Gastroesophageal reflux disease. 8. History of asymptomatic sinus bradycardia.  ALLERGIES:  Macrodantin.  PROCEDURE/DIAGNOSTICS PERFORMED:   1. Left groin ultrasound duplex showing an area of mixed echoes in the left groin measuring 4.02 cm x 2.35 cm.  No evidence of active bleeding into the area.  Area is consistent with a thrombosed pseudoaneurysm versus a hematoma.  No evidence of pseudoaneurysm.  No evidence of arteriovenous fistula.  REASON FOR HOSPITALIZATION:  This is a 70 year old female with the above- stated problem list who underwent left heart catheterization via the left femoral artery on June 02, 2010.  No complications with the procedure were noted.  The patient was discharged home without significant groin discomfort or hematoma.  Once at home the patient noticed onset of left groin pain that was moderate to severe with formation of bruising and  swelling of the area.  The patient reported to [facility] Emergency Department when symptoms did not improve.  The patient was admitted by Cardiology Service for further evaluation.  HOSPITAL COURSE:  The area around the bruising and hematoma was outlined and a pressure dressing was applied to this area.  Bedrest was advised. Initial CBC showed hemoglobin 11.7 and hematocrit 34.4.  A followup CBC in the morning showed hemoglobin of 11.1 with a drop in hemoglobin to 10.  A left groin ultrasound was completed.  This showed no evidence of pseudoaneurysm.  There was no active bleeding at the site but there was an area of mixed echoes measuring approximately 4 cm x 2 cm that was consistent with a thrombosed pseudoaneurysm versus hematoma.  The patient's pain had improved with overnight pain management.  The patient's left groin was noted to have moderate hematoma, but was soft with distal pulses.  The patient's Plavix was held.  When asked to ambulate , she was able to do this without difficulty.  On the day of discharge, it was noted that the patient's left groin was sore but without sharp pain.  Her site was with moderate to severe ecchymosis but no bruise.  The area was soft  to touch.  Her pulses were 2+ and equal bilaterally and distally.    On day of discharge, Dr. [place] evaluated the patient and noted her stable for home with outpatient followup.  She again will ambulate. It was decided that the patient should hold her Plavix for 1 week.  She will continue her aspirin.  She will hold on nitrates at this time until followup with Dr. Andee Lineman.  Now with followup she will also have a CBC. Discharge instructions and plans were discussed with patient and she voiced understanding.  DISCHARGE LABORATORY FINDINGS:  Vital signs stable.  WBC 6.7, hemoglobin 10, hematocrit 30.5, platelet 184.  DISCHARGE MEDICATIONS: 1. Aspirin 81 mg daily. 2. Fish oil 1000 mg 2 capsules twice daily. 3.  Imipramine 25 mg 1 tablet every evening as needed for sleep. 4. Isosorbide mononitrate XR 60 mg 1 tablet daily, the patient is to     follow this until followup appointment on the 16th. 5. Lipitor 40 mg 1 tablet daily. 6. Neurontin 100 mg 1 capsule daily. 7. Nitroglycerin sublingual 0.4 mg 1 tablet under tongue every 5     minutes as needed up to 3 doses for chest pain. 8. Norvasc 5 mg 1 tablet daily. 9. Plavix 75 mg 1 tablet daily, the patient is to hold until followup     appointment with Dr. Andee Lineman in next week. 10.Toprol-XL 500 mg one and half tablets daily. 11.Vitamin D3 2000 units 1 capsule daily. 12.Xanax 0.25 mg 1 tablet daily at bedtime as needed for anxiety.  FOLLOWUP PLANS AND INSTRUCTIONS: 1. The patient is to increase her activity slowly.  She is not to lift     anything for 1 week greater than 5 pounds.  No sexual activity for     1 week.  She is to call our office for worsening pain or increased     hematoma. 2. The patient is to follow up with Dr. Andee Lineman on February 16 at 1:00     p.m.  At this time she will also obtain a CBC and groin check.  3.     The patient is to avoid straining. 3. Duration, greater than 30 minutes with physician and physician     extender time.     Leonette Monarch, PA-C   ______________________________ Peter M. [place], M.D.    NB/MEDQ  D:  06/05/2010  T:  06/06/2010  Job:  [id number]  cc:   Learta Codding, MD,FACC Kirstie Peri, MD  Electronically Signed by Alen Blew P.A. on 06/08/2010 04:17:12 PM Electronically Signed by PETER [place] M.D. on 06/10/2010 11:17:10 AM

## 2010-06-10 NOTE — Procedures (Signed)
NAME:  Linda [name], STOFKO NO.:  [id number]  MEDICAL RECORD NO.:  [id number]           PATIENT TYPE:  I  LOCATION:  3741                         FACILITY:  [facility]  PHYSICIAN:  Peter M. Swaziland, M.D.  DATE OF BIRTH:  [birth year]  DATE OF PROCEDURE:  06/02/2010 DATE OF DISCHARGE:                           CARDIAC CATHETERIZATION   INDICATIONS FOR PROCEDURE:  A 70 year old white female with history of coronary artery disease.  She has had previous coronary artery bypass surgery and multiple coronary interventions.  She presents with symptoms of increased chest pain.  PROCEDURES: 1. Left heart catheterization. 2. Coronary and left ventricular angiography. 3. Saphenous vein graft. 4. Angiography x2.  Accesses is via the left femoral artery using the standard Seldinger technique.  EQUIPMENT:  A 6-French 4 cm right and left Judkins catheter, 6-French pigtail catheter, 6-French arterial sheath.  MEDICATIONS:  Local anesthesia with 1% Xylocaine, Versed 2 mg IV.  CONTRAST:  100 mL of Omnipaque.  HEMODYNAMIC DATA:  Aortic pressure 143/70 with a mean of 99 mmHg.  Left ventricular pressure is 138 with an EDP of 7 mmHg.  ANGIOGRAPHIC DATA:  The left coronary artery arises and distributes normally.  There is a smooth 50% stenosis in the distal left main coronary artery with circumferential calcification.  The left anterior descending artery is a large vessel.  There is a long stent in the mid vessel, which has less than or equal to 20% in-stent disease.  The remainder of the LAD appears normal.  The left circumflex coronary artery gives rise to a very small atrial branch, which has a 95% stenosis.  The main left circumflex coronary artery is occluded.  We did not directly cannulate the right coronary ostia since this was documented to be occluded previously.  Saphenous vein graft to the right coronary artery is widely patent with excellent filling of the entire right  coronary artery.  Saphenous vein graft to the obtuse marginal vessel is widely patent. There is a stent noted in the ostium.  There is approximately 30% narrowing at the ostium of the stent.  We did not visualize the IMA graft since this was also documented to be atretic in the past.  Left ventricular angiography was performed in the RAO view.  This demonstrates normal left ventricular size and contractility with normal systolic function.  Ejection fraction is estimated at 65%.  IMPRESSION: 1. Two-vessel obstructive atherosclerotic coronary artery disease. 2. 50% distal left main coronary stenosis. 3. Patent saphenous vein grafts to the obtuse marginal vessel and to     the right coronary artery. 4. Normal left ventricular function.  PLAN:  Comparison was made with a prior cardiac catheterization in May 2008 and there was no angiographic change.  It is noteworthy that the left main coronary artery was evaluated by intravascular ultrasound on her last evaluation and this was felt to be a nonsignificant stenosis. I would recommend continued medical therapy.          ______________________________ Peter M. Swaziland, M.D.     PMJ/MEDQ  D:  06/02/2010  T:  06/03/2010  Job:  [id number]  cc:   Kirstie Peri, MD Luis Abed, MD, [facility]  Electronically Signed by PETER Swaziland M.D. on 06/10/2010 11:17:01 AM

## 2010-06-11 ENCOUNTER — Encounter (INDEPENDENT_AMBULATORY_CARE_PROVIDER_SITE_OTHER): Payer: Medicare Other | Admitting: Cardiology

## 2010-06-11 ENCOUNTER — Encounter: Payer: Self-pay | Admitting: Cardiology

## 2010-06-11 DIAGNOSIS — I2510 Atherosclerotic heart disease of native coronary artery without angina pectoris: Secondary | ICD-10-CM

## 2010-06-11 NOTE — Progress Notes (Signed)
Summary: NEED 2 WEEK F/U CATH APPT  Phone Note Call from Patient Call back at Home Phone [phone]   Caller: Patient Call For: NURSE Summary of Call: message left on nurse voicemail from patient saying she was d/c from [facility] yesterday and had a cath. patient was advised to f/u in 2 weeks called our office and was transferred to nurse voicemail to arrange appt. Initial call taken by: Carlye Grippe,  June 03, 2010 4:47 PM  Follow-up for Phone Call        Pt scheduled for Monday February 27th with Dr. Antoine Poche d/t Dr. Myrtis Ser first available being March 26th. Pt verbalized understanding.  Follow-up by: Cyril Loosen, RN, BSN,  June 03, 2010 5:01 PM

## 2010-06-17 NOTE — Progress Notes (Signed)
Summary: QUESTION ABOUT WHEN LABS NEEDED  Phone Note Call from Patient Call back at Home Phone [phone]   Caller: Patient Call For: nurse Summary of Call: patient left message on nurse voicemail that she has appt with [facility] on 16th and her paperwork said she needed to have labs and get groin checked. please clarify with patient when labs are needed. Initial call taken by: Carlye Grippe,  June 05, 2010 3:19 PM  Follow-up for Phone Call        patient informed that she would be informed during ov about her lab instructions. Follow-up by: Carlye Grippe,  June 09, 2010 10:26 AM

## 2010-06-17 NOTE — Assessment & Plan Note (Signed)
Summary: post cath, groin bleed needs cbc and groin check per dr Sharyn Blitz...   Visit Type:  Follow-up Primary Provider:  Beatrix Fetters Shah,MD   History of Present Illness: the patient is a 70 year old female with history of coronary artery disease.  She status post coronary bypass grafting.  She underwent a catheterization and was found to have two out of 3 grafts to be patent.  It was felt that she could be treated medically.  Unfortunately she developed a pseudoaneurysm post catheterization with a large groin hematoma.  Ultrasound eventually showed a thrombosed pseudoaneurysm in the left groin.  The patient is normally seen by Dr. Myrtis Ser that needed follow-up for her groin check.  The ultrasound showed no AV fistula and today on exam there is no residual bruit.  Unfortunately her repeat CBC was never scheduled and this will be done today.  The patient also significant peripheral vascular disease the right renal stenosis and right common iliac stenosis.  She denies however any claudication.  Plavix was placed on hold given her recent bleed.  The patient is of a history of TIAs.  She's questioning when she can restart her Plavix.  She has continued aspirin in the meanwhile.    The patient denies any chest pain, shortness of breath orthopnea or PND.  Preventive Screening-Counseling & Management  Alcohol-Tobacco     Smoking Status: quit     Year Quit: 2001  Current Medications (verified): 1)  Plavix 75 Mg Tabs (Clopidogrel Bisulfate) .... Take 1 Tablet By Mouth Once A Day(On Hold) 2)  Aspirin 81 Mg Tbec (Aspirin) .... Take One Tablet By Mouth Daily 3)  Fish Oil 1000 Mg Caps (Omega-3 Fatty Acids) .... Take 2 Capsules By Mouth Two Times A Day 4)  Amlodipine Besylate 5 Mg Tabs (Amlodipine Besylate) .... Take One Tablet By Mouth Daily 5)  Isosorbide Mononitrate Cr 60 Mg Xr24h-Tab (Isosorbide Mononitrate) .... Take 1 Tablet By Mouth Once A Day(On Hold) 6)  Neurontin 100 Mg Caps (Gabapentin) .... Take 1 Tab  By Mouth At Bedtime 7)  Metoprolol Succinate 50 Mg Xr24h-Tab (Metoprolol Succinate) .... Take 1&1/2 Tablet By Mouth Daily 8)  Imipramine Hcl 25 Mg Tabs (Imipramine Hcl) .... Take 1 Tablet By Mouth Once A Day 9)  Nitrostat 0.4 Mg Subl (Nitroglycerin) .... Use As Directed 10)  Vitamin D 2000 Unit Tabs (Cholecalciferol) .... Take 1 Tablet By Mouth Once A Day 11)  Lipitor 40 Mg Tabs (Atorvastatin Calcium) .... Take One Tablet By Mouth At Bedtime. 12)  Alprazolam 0.25 Mg Tabs (Alprazolam) .... Take 1 Tablet By Mouth Once A Day At Bedtime As Needed  Allergies: 1)  ! * Nitrofurantoin 2)  Sulfa  Comments:  Nurse/Medical Assistant: The patient's medication list and allergies were reviewed with the patient and were updated in the Medication and Allergy Lists.  Past History:  Family History: Last updated: 11/14/2008 Family History of Coronary Artery Disease:   Social History: Last updated: 11/14/2008 Retired  Married  Tobacco Use - Former.  - '02 Alcohol Use - no  Risk Factors: Smoking Status: quit (06/11/2010)  Past Medical History: HYPERTENSION, UNSPECIFIED (ICD-401.9) HYPERLIPIDEMIA-MIXED (ICD-272.4) CABG CAD...cath May, 2008.Marland KitchenMarland KitchenLIMA atretic.Marland Kitchen60% L Main.Marland KitchenMarland KitchenIf problems, recath with IVUS to assess L Main  /  Cardiolite  April, 2010...no ischemia EF 70%...nuclear scan...  07/2008 Bradycardia...sinus...assymptomatic PAD, right renal artery stenosis and right common iliac stenosis COPD TIA...Marland Kitchenremote.. repeat cardiac catheterization in January of 2012 stable anatomy two out of 3 grafts patent medical therapy    Review of Systems  The patient denies fatigue, malaise, fever, weight gain/loss, vision loss, decreased hearing, hoarseness, chest pain, palpitations, shortness of breath, prolonged cough, wheezing, sleep apnea, coughing up blood, abdominal pain, blood in stool, nausea, vomiting, diarrhea, heartburn, incontinence, blood in urine, muscle weakness, joint pain, leg swelling, rash,  skin lesions, headache, fainting, dizziness, depression, anxiety, enlarged lymph nodes, easy bruising or bleeding, and environmental allergies.         large hematoma left groin but stable  Vital Signs:  Patient profile:   70 year old female Height:      61.5 inches Weight:      146 pounds Pulse rate:   67 / minute BP sitting:   126 / 73  (left arm) Cuff size:   regular  Vitals Entered By: Carlye Grippe (June 11, 2010 1:09 PM)  Physical Exam  Additional Exam:  General: Well-developed, well-nourished in no distress head: Normocephalic and atraumatic eyes PERRLA/EOMI intact, conjunctiva and lids normal nose: No deformity or lesions mouth normal dentition, normal posterior pharynx neck: Supple, no JVD.  No masses, thyromegaly or abnormal cervical nodes lungs: Normal breath sounds bilaterally without wheezing.  Normal percussion heart: regular rate and rhythm with normal S1 and S2, no S3 or S4.  PMI is normal.  No pathological murmurs abdomen: Normal bowel sounds, abdomen is soft and nontender without masses, organomegaly or hernias noted.  No hepatosplenomegaly musculoskeletal: Back normal, normal gait muscle strength and tone normal pulsus:diminished pulses in the lower extremities.  No bruit in the left groin.  Large hematoma which appears to be resolving in the left eye Extremities: No peripheral pitting edema neurologic: Alert and oriented x 3 skin: Intact without lesions or rashesfrom a large resolving left groin hematoma. cervical nodes: No significant adenopathy psychologic: Normal affect    Impression & Recommendations:  Problem # 1:  TIA (ICD-435.9) the patient has a prior history of TIA and I told her that she can restart her Plavix next week.a follow-up CBC and PT/INR will be drawn today.  Hemoglobin on discharge was 10.2  Problem # 2:  CORONARY ARTERY BYPASS GRAFT, HX OF (ICD-V45.81) patient is status post cardiac catheterization.  Her anatomy was stable.  Two  out of 3 grafts are patent.  Medical therapy was recommended by Dr. Swaziland. Orders: T-CBC No Diff ([id number]) T-Protime, Auto ([id number])  Problem # 3:  HYPERTENSION, UNSPECIFIED (ICD-401.9) blood pressure stable.  Continue current medical regimen. Her updated medication list for this problem includes:    Aspirin 81 Mg Tbec (Aspirin) .Marland Kitchen... Take one tablet by mouth daily    Amlodipine Besylate 5 Mg Tabs (Amlodipine besylate) .Marland Kitchen... Take one tablet by mouth daily    Metoprolol Succinate 50 Mg Xr24h-tab (Metoprolol succinate) .Marland Kitchen... Take 1&1/2 tablet by mouth daily  Patient Instructions: 1)  Labs:  CBC, PT/INR  2)  Hold Plavix x 1 week 3)  Restart Imdur 4)  Follow up in  6 months with Dr. Myrtis Ser

## 2010-06-22 ENCOUNTER — Encounter: Payer: Medicare Other | Admitting: Cardiology

## 2010-06-26 NOTE — H&P (Signed)
NAME:  Linda [name], Linda [name] NO.:  [id number]  MEDICAL RECORD NO.:  [id number]           PATIENT TYPE:  I  LOCATION:  3743                         FACILITY:  [facility]  PHYSICIAN:  Brayton El, MD    DATE OF BIRTH:  Jun 19, 1940  DATE OF ADMISSION:  06/03/2010 DATE OF DISCHARGE:                             HISTORY & PHYSICAL   CHIEF COMPLAINT:  Left groin bruising and swelling after heart cath.  HISTORY OF PRESENT ILLNESS:  Linda [name] is a 70 year old white female with past medical history significant for coronary artery disease status post CABG in 2002 with multiple subsequent PCIs.  This morning she underwent a left heart catheterization via the left femoral artery with a 6-French sheath.  Per the cath report, there is no significant change since 2008.  There was also no note of access complication.  The patient states that after the procedure there was some minor difficulty with achieving hemostasis, however, it was achieved.  She was discharged without any significant groin discomfort or hematoma.  Once at home while walking she noticed onset of left groin pain that was rated moderate to severe.  She also had formation of bruising and some swelling in the area.  The patient states that she was concerned about blood clots and attempted to walk as much as she could every couple of hours.  She reported to the [facility] Emergency Room when the symptoms did not subside.  Currently, the patient's pain is much improved.  PAST MEDICAL HISTORY:  As above in HPI.  The patient also has a history of TIA, hypertension, hyperlipidemia, GERD, and peripheral vascular disease.  SOCIAL HISTORY:  History of tobacco use.  No significant amount of alcohol.  No drug use.  FAMILY HISTORY:  Positive for coronary artery disease.  ALLERGIES: 1. MACRODANTIN. 2. SULFA.  MEDICATIONS:  Per the [facility] list is: 1. Aspirin 81 mg daily. 2. Plavix 75 mg daily. 3. Toprol-XL 75 mg  daily. 4. Norvasc 5 mg daily. 5. Neurontin 100 mg every evening. 6. Lipitor 40 mg every evening. 7. Imdur 60 mg every day. 8. Fish oil 2000 mg twice a day. 9. Xanax 0.25 mg b.i.d.  REVIEW OF SYSTEMS:  As in HPI, all other systems are reviewed and are negative.  PHYSICAL EXAMINATION:  VITAL SIGNS:  Temperature is 98, blood pressure is 123/79, heart rate is 105, respiratory rate is 18, and saturating 100% on room air.  Of note, the patient's vitals are similar to what they were this morning. GENERAL:  No acute distress. HEENT:  Normocephalic and atraumatic. HEART:  Regular rate and rhythm without murmur, rub, or gallop. LUNGS:  Clear bilaterally. ABDOMEN:  Soft, nontender, and nondistended. EXTREMITIES:  Without edema.  There is 2+ left-sided dorsalis pedis pulse.  There is a 1+ left femoral artery pulse with no bruit auscultated.  There is approximately 2-3 inches inferior to the arteriotomy site, there is a palpable hematoma that is approximately 3 inches in diameter.  There is also an extensive area overlying bruising extending partially down her anterior and inner thigh.  There is tenderness  to palpation.  There is no swelling over the actual arteriotomy sites.  ASSESSMENT AND PLAN:  Bruising and hematoma after left heart catheterization.  It appears the patient has current hemostasis.  The area of bruising around hematoma has been outlined.  We have applied a pressure dressing over the area of the hematoma.  Overnight, she should remain on bedrest.  Her pain will be managed with oxycodone.  The nurse will check the area frequently and contact me if the area appears to be expanding.  We will check a CBC now and in the morning.  Tomorrow, we will also order an ultrasound Doppler of the left groin and left femoral artery.     Brayton El, MD     SGA/MEDQ  D:  06/03/2010  T:  06/03/2010  Job:  [id number]  Electronically Signed by Raynelle Bring MD on 06/26/2010 10:35:09  AM

## 2010-09-08 NOTE — Assessment & Plan Note (Signed)
[facility]                          [facility] OFFICE NOTE   Linda [name], Linda [name]                     MRN:          [id number]  DATE:10/03/2007                            DOB:          11-24-1940    I spoke with Lavella Hammock Jeter by telephone today.  She had called and  spoken with Boneta Lucks in the office yesterday.  She has been having some  sharp discomfort in her throat.  She was calling to be careful.  Historically, before she had her CABG several years ago she had had  discomfort with a discomfort throughout her throat and some shortness of  breath with exercising.  This has also occurred before she had her  stents, since her CABG.  Most recently, over several weeks she has had  several episodes of different type of throat discomfort that is sharp.  It occurs for a few minutes.  It does not occur with exercise.  She is  continuing her exercise program without difficulty.  [place] and she was concerned and  wanted to be sure that she and I discuss this.  When I called her today,  the history is as outlined above.  I am comfortable that this is most  likely not her ischemic pain.  She has her usual meds.  She also has  nitroglycerin that she will carry with her.  She has not needed the  nitroglycerin for these symptoms as they are too short lived.   A review of her anatomy reveals that she is post CABG.  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  She does have a 60% left main  that is being kept in mind.  Her grafts were right and circumflex, were  patent.  It was felt that her left main was not causing ischemia to her  LAD.   It is of note that she was recently hospitalized also and had a nuclear  exercise scan showing no ischemia.   Currently, I am not convinced that her symptoms are cardiac.  I have  encouraged her to go [place] the lake.  We will make her an appointment to  see [place]  soon after she returns.  If she has any major change in her  symptom she will be in touch.  If we need to become more aggressive, the  next step would be a follow up cath with potential IVUS of her left  main.     Luis Abed, MD, [facility]  Electronically Signed    JDK/MedQ  DD: 10/03/2007  DT: 10/03/2007  Job #: [id number]

## 2010-09-08 NOTE — Assessment & Plan Note (Signed)
[facility]                          [facility] NOTE   Linda [name], Linda [name]                     MRN:          [id number]  DATE:03/03/2007                            DOB:          February 20, 1941    PRIMARY CARDIOLOGIST:  Dr. Willa Rough.   REASON FOR VISIT:  Scheduled clinic followup.   Since last seen here in the clinic in June, patient continues to do  extremely well with no intermittent development of signs/symptoms  suggestive of unstable angina pectoris or congestive heart failure.   When last seen, I adjusted medications with up titration of Norvasc to 5  daily for better control.  He is tolerating this quite well.  We also  agreed to stop Vytorin, given her concerns about Zetia at that time, and  she is tolerating Simvastatin 40 daily.  In the meanwhile, I also  increased her fish oil to 2 capsules twice a day for more aggressive  treatment of her low HDL levels.  Most recent profile in mid September  notable for a total cholesterol of 170, triglycerides 132, HDL 37, and  LDL of 107.   Despite returning to the clinic with a blood pressure of 158 systolic,  Ms. Dakin claims her blood pressure is much better controlled at home.   CURRENT MEDICATIONS:  1. Plavix.  2. Aspirin 81 daily.  3. Fish oil 2 capsules b.i.d.  4. Norvasc 5 mg daily.  5. Toprol XL 75 daily.  6. Imdur 90 daily.  7. Simvastatin 40 nightly.   PHYSICAL EXAMINATION:  Blood pressure 158/81.  Pulse 59, regular.  Weight 145.  GENERAL:  A 70 year old female sitting upright in no distress.  HEENT:  Normocephalic and atraumatic.  NECK:  Palpable bilateral carotid pulses without bruits.  No JVD.  LUNGS:  Clear to auscultation in all fields.  HEART:  Regular rate and rhythm (S1 and S2).  No significant murmurs.  ABDOMEN:  Soft and nontender.  EXTREMITIES:  Palpable distal pulses without significant edema.  NEUROLOGIC:  No focal deficits.   IMPRESSION:  1.  Coronary artery disease.      a.     Status post cardiac catheterization in May 2008.  Medical       therapy recommended.      b.     Patent bypass grafts with previously documented atretic LIMA       graft, as previously outlined.      c.     Normal left ventricular function.      d.     Status post coronary artery bypass graft in 2002.  2. Peripheral vascular disease.      a.     As previously outlined.  3. Dyslipidemia.  4. Chronic obstructive pulmonary disease/history of tobacco.  5. Hypertension.  6. Remote transient ischemic attack.  7. Asymptomatic sinus bradycardia.   PLAN:  1. Continue current medication regimen.  2. Followup fasting lipid profile in 1 month.  3. Return clinic visit with myself and Dr. Myrtis Ser in 6 months.      Gene Serpe, PA-C  Electronically Signed  Luis Abed, MD, [facility]  Electronically Signed   GS/MedQ  DD: 03/03/2007  DT: 03/04/2007  Job #: [id number]   cc:   Kirstie Peri, MD

## 2010-09-08 NOTE — Assessment & Plan Note (Signed)
[facility]                          [facility]   JOLEENE, BURNHAM                     MRN:          [id number]  DATE:10/10/2007                            DOB:          [birth year]    Linda [name] is here for cardiology followup.  I saw her last in the  office on 09/05/2007.  Since that time, I have been on the telephone  with her.  She has had some sensations in her throat.  In the past, she  had this along with more marked sensation and some shortness of breath  and sometimes arm discomfort with exercise.  This appears to be her  angina.  However, these very brief sensations in her throat are present.  I am not convinced this is her angina.  We had very careful and  extensive discussions about this.  We want to be sure that we were not  missing an opportunity to be more complete, but she has no proven  ischemia by a Myoview scan in April 2009.  I believe it is most prudent  to continue to watch her.  The patient goes about full activities.  She  does not have this sensation in her neck with any type of exercise.   PAST MEDICAL HISTORY:   ALLERGIES:  No know drug allergies.  She is intolerant to IBUPROFEN.   MEDICATIONS:  1. Plavix 75.  2. Aspirin 81.  3. Fish oil.  4. Norvasc.  5. Toprol 50.  6. Imdur 60 (headache if we go to a higher dose).  7. Crestor 20.   OTHER MEDICAL PROBLEMS:  See the list below.   REVIEW OF SYSTEMS:  Today, she really feels well.  She is not having any  chest pain or neck pain.  She has no GI or GU symptoms.  She is going  about full activities.  None of the symptoms that she has had has  limited her.  Review of systems otherwise is negative.   PHYSICAL EXAMINATION:  Blood pressure today is 130/77 with a pulse of  60.  Her weight is 143.  The patient is oriented to person, time, and place.  Affect is normal.  HEENT:  No xanthelasma.  She has normal extraocular motion.  NECK:  There are no  carotid bruits.  There is no jugular venous  distention.  LUNGS:  Clear.  Respiratory effort is not labored.  CARDIAC:  S1 with an S2.  There are no clicks or significant murmurs.  ABDOMEN:  Soft.  She has no peripheral edema.   EKG reveals sinus rhythm with sinus bradycardia.  She has diffuse  nonspecific ST-T wave changes.  These include anterior T changes.  There  is no significant change.   PROBLEMS:  1. Coronary disease.  The patient's last cath was done in May 2008.      Medical therapy was recommended.  She has an atretic left internal      mammary artery.  There is a 60% to 65% left main lesion.  This      would affect flow to her left  anterior descending if it became      worse.  This will have to be kept in mind.  It is a distal left      main lesion.  There is some calcium.  At some point, we may need to      proceed with repeat catheterization, but she and I both have chosen      not to do that at this time.  2. Status post coronary artery bypass graft with patent vein grafts,      but an atretic left internal mammary artery to the left anterior      descending.  3. Normal left ventricular function.  4. Peripheral vascular disease.  5. Dyslipidemia, treated.  6. Chronic obstructive pulmonary disease.  7. Hypertension, treated.  8. History of a remote transient ischemic attack.  9. Asymptomatic sinus bradycardia.  We will keep her sinus bradycardia      in mind, but I am still not inclined to change any of her      medicines.  10.Symptom of some neck and throat discomfort.  Once again, we      carefully discussed whether or not we think this is angina.  I do      not think further exercise testing will help [place], as the study in      April 2007 showed no marked abnormalities.  She and I both agree      that we will not proceed with repeat catheterization at this time.      I raised the question of using Ranexa.  I am not inclined to start      that at this time either.   She will go about full activities.  If      she has increasing symptoms or exertionally related symptoms or      other changes, she will be in touch.  If this would to be the case,      I would proceed with repeat catheterization with repeat IVUS of her      left main lesion if necessary.  Otherwise, she will go about full      activities and I will see her back in 6 weeks.  We reviewed her      symptoms extensively.      Luis Abed, MD, [facility]  Electronically Signed    JDK/MedQ  DD: 10/10/2007  DT: 10/10/2007  Job #: [id number]   cc:   Kirstie Peri, MD

## 2010-09-08 NOTE — Cardiovascular Report (Signed)
NAME:  Linda [name], Linda [name] NO.:  [id number]   MEDICAL RECORD NO.:  [id number]          PATIENT TYPE:  OIB   LOCATION:  2852                         FACILITY:  [facility]   PHYSICIAN:  Salvadore Farber, MD  DATE OF BIRTH:  May 18, 1940   DATE OF PROCEDURE:  09/21/2006  DATE OF DISCHARGE:                            CARDIAC CATHETERIZATION   PROCEDURE:  Left heart catheterization, left ventriculography, coronary  and saphenous vein graft angiography, intravascular ultrasound of the  left anterior descending, StarClose closure of the left common femoral  arteriotomy site.   INDICATIONS:  Mrs. Struve is a 70 year old woman with coronary disease  for which she is status post CABG.  She underwent cardiac  catheterization and February of this year by Dr. Riley Kill.  That  demonstrated an atretic LIMA graft to the LAD, patent vein grafts to the  RCA and circumflex, and a 60% stenosis of the left main.  She has been  managed medically.  She has had chest discomfort more frequently over  the past week, occurring primarily with exertion.  She was, therefore,  referred for diagnostic angiography with planned for intravascular  ultrasound of the left main stenosis and possible percutaneous coronary  intervention.   PROCEDURAL TECHNIQUE:  Informed consent was obtained.  Anticoagulation  was initiated with bivalirudin.  ACT was confirmed to be greater than  225 seconds.  Under 1% lidocaine local anesthesia, a 5-French sheath was  placed in the right common femoral artery using the modified Seldinger  technique.  Diagnostic angiography was performed to the native left  system using a JL-4 catheter.  Angiography of the native right system  was not performed as it had previously been shown to be occluded  proximally.  JR-4 catheter was used to selectively engage the vein graft  to the obtuse marginal and the vein graft to the right coronary artery.  No images of the LIMA were obtained as it  was previously atretic.  Left  heart catheterization and ventriculography were performed using a  pigtail catheter.   These images demonstrated no change in the anatomy compared with  February.  Given the ambiguity of the left main stenosis, we elected to  proceed with intravascular ultrasound as had been planned.  A sheath was  upsized over wire to 6-French.  A 6-French JL-4 guiding catheter was  advanced over wire and engaged in the ostium of left main.  Prowater  wire was advanced to the distal LAD without difficulty.  Intravascular  ultrasound catheter was then advanced to the mid-LAD.  Automated pull-  back was performed.  This demonstrated a minimal lumen area of 4.3 mm  versus a proximal left main reference of 12.5.  Did not think this was  likely significant.  We elected, therefore, to manage her medically.  Catheter and wire were removed.  The arteriotomy was closed using a  StarClose device.  Complete hemostasis was obtained.  The patient was  then transferred to the holding room in stable condition having  tolerated the procedure well.   COMPLICATIONS:  None.   FINDINGS:  1. Ejection  fraction 65% without regional wall motion abnormality.  2. No aortic stenosis or mitral regurgitation.  3. Left main:  60% distal stenosis with circumferential calcium.  Area      stenosis to 65%.  4. Left anterior descending:  Moderate-sized vessel giving rise to two      fairly small diagonals.  There is a previously placed stent in the      mid vessel which is widely patent with no in-stent restenosis.  5. Circumflex:  Vessel is occluded proximally.  A small atrial branch      has a 95% stenosis proximally.  This vessel is approximately 1 mm      in diameter.  The vein graft to the obtuse marginal is patent with      excellent distal runoff.  There is a previously placed stent at its      ostium with approximately 50% in-stent restenosis.  This is      unchanged compared with February.   6. Right coronary artery:  Vessel previously shown be occluded      proximally.  There is a widely patent vein graft to the distal      vessel.  Just beyond this there is a 50% focal stenosis.   IMPRESSION/PLAN:  The patient has only a moderate left main stenosis and  widely patent grafts to the circumflex and right coronary artery.  Will  continue with medical therapy.      Salvadore Farber, MD  Electronically Signed     WED/MEDQ  D:  09/21/2006  T:  09/21/2006  Job:  [id number]   cc:   Learta Codding, MD,FACC  Dhruv Sherril Croon

## 2010-09-08 NOTE — Assessment & Plan Note (Signed)
[facility]                          [facility] NOTE   Linda, [name]                     MRN:          [id number]  DATE:09/20/2006                            DOB:          07-24-1940    HISTORY OF PRESENT ILLNESS:  The patient is a pleasant 70 year old  female patient of Dr. Myrtis Ser with known coronary artery disease.  I saw  the patient in followup on February 21 after she had complained to Dr.  Myrtis Ser of throat pain and sensation of burning in the chest.  I was  concerned about the patient's symptoms and referred her for a diagnostic  catheterization.  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  Dr. Riley Kill performed her catheterization  June 17, 2006.  She was found to have an atretic and diseased LIMA  graft, she also had a 50% - 60% __________  the left main coronary  artery with perhaps live progression from the previous study.  He felt  that she had continued patency of the stent.  The mid LAD, there was  also diffuse diseased nodes of a previously placed circumflex stent with  slow filling distally.  There was a patent saphenous vein graft to the  distal right coronary artery.  The LV function was preserved.  Following  this, it was felt the patient could be treated medically but with a  caveat that if she had ongoing symptoms she might benefit from  __________  of the left main coronary artery.   The patient states now that she had recurrent problems.  I got a call  from Dr. Sherryll Burger last week stating that the patient had substernal chest  pain and back pain.  I have asked him to double her Imdur and I arranged  for her to be seen today as an add-on.  The patient reports to me that  [place]  While she was there she  started developing pain between the shoulder blades as well as her  throat pain symptoms that she had previously mentioned to me in  February.  She stated the pain was off and on with a burning sensation.  She felt [place] and actually presented  to the local ER first after she had experienced an episode of substernal  back pressure and back pain when climbing 7 steps.  She reported the  pain shooting through her back.  She was seen in the emergency room, was  kept overnight in [facility].  She ruled  out for myocardial infarction, EKG was abnormal with ST segment  depression.  However these changes were not different from February of  2008 given the fact the enzymes were negative, the patient was  discharged home but arrangements were made with followup with Dr. Sherryll Burger.  She states that on Friday and throughout the weekend she had several  more episodes although she curtailed her activity.   MEDICATIONS:  1. Plavix 75 mg p.o. daily.  2. Aspirin 81 mg a day.  3. Fish oil daily.  4. Vytorin 10/40 daily.  5. Norvasc 5 mg 1/2 tablet p.o. daily.  6. Toprol XL 75 mg p.o. daily.  7. Imdur 60 mg p.o. daily (new dosing).   ALLERGIES:  MACRODANTIN AND SULFA.   REVIEW OF SYSTEMS:  As per HPI, no nausea, vomiting.  No fevers, chills.  No melena, hematochezia.  No dysuria or frequency.  No orthopnea, PND.  No palpitations or syncope.   PHYSICAL EXAMINATION:  VITAL SIGNS:  Blood pressure 131/75, heart rate  64 beats per minute, weight 140 pounds.  GENERAL:  Well-nourished white female, in no apparent distress.  HEENT:  Pupils anisocoric, conjunctivae clear.  NECK:  Supple, normal carotid upstroke, no carotid bruits.  LUNGS:  Clear breath sounds bilaterally.  HEART:  Regular rate and rhythm with normal S1, S2; no murmur, rubs, or  gallops.  ABDOMEN:  Soft, nontender.  EXTREMITY:  No cyanosis, clubbing, or edema.   PROBLEM LIST:  1. Throat pain/back pain.      a.     Rule out angina equivalent.       b.     Coronary artery disease status post coronary bypass grafting       2002.      c.     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      d.     Status post catheterization February 2008, see details       above, with 56% - 60% left main coronary artery stenosis and slow       filling of the circumflex coronary artery.  2. Peripheral vascular disease.      a.     Eighty percent right renal artery stenosis.      b.     Seventy percent right common iliac stenosis __________  no       claudication.  3. Dyslipidemia.  4. SULFA ALLERGY.  5. Chronic obstructive pulmonary disease (mild).  6. Gastroesophageal reflux disease.  7. History of tobacco use, stopped in 2002.   PLAN:  1. The patient's symptoms are concerning for recurrent angina.  The      patient prior to her coronary bypass grafting never really      experienced typical chest pain.  I suspect this is her angina      equivalent.  2. Given the results of the 60% left main stenosis, low filling in the      circumflex, the patient has several potential sources for ischemia.      I do not feel proceeding with exercise testing is indicated, but      rather we should proceed with IVUS of the left main coronary artery      and possible stenting if indicated.  3. The patient has been scheduled for inpatient catheterization, I      understand that Dr. Samule Ohm will be performing this procedure.  I      tried to contact Dr. Samule Ohm the day before but he is currently out      of town and we will contact him the day of the procedure.  If it is      felt that the patient's coronary anatomy is unchanged and unlikely      to have ischemia she will need a GI workup also.  She also might      benefit from an aortic root shot to make sure  that there is no      evidence of dissection, although I think this is distinctly      unlikely. 4. I have also increased the patient's Imdur from 60 to 90 mg a day,      she can continue on aspirin and  Plavix.     Learta Codding, MD,FACC  Electronically Signed    GED/MedQ  DD: 09/20/2006  DT: 09/20/2006  Job #: [id number]

## 2010-09-08 NOTE — Assessment & Plan Note (Signed)
[facility]                          [facility]   MCKINZI, ERIKSEN                     MRN:          [id number]  DATE:11/28/2007                            DOB:          Aug 03, 1940    Ms. Stimmel is seen for followup.  See my extensive note of October 10, 2007.  We carefully reviewed sensations she was having at that time  including a sensation in her throat.  After very very careful review, we  decided this was not cardiac.  We decided to follow her at that time.  I  lowered her Imdur dose because of headaches.  She tells me her headaches  are greatly improved.  She is not having any recurrent symptoms.  She  feels great.  With this in mind, we will not be considering the  consideration of IVUS of her left main at this time.   ALLERGIES:  No known drug allergies.   MEDICATIONS:  Plavix 75, aspirin 81, fish oil, Norvasc, metoprolol,  Imdur 60, and Crestor 20.   OTHER MEDICAL PROBLEMS:  See the list below.   REVIEW OF SYSTEMS:  Other than the HPI, her review of systems is  negative.   PHYSICAL EXAM:  VITAL SIGNS:  Blood pressure 150/87.  Pulse is 61.  Weight is 145 pounds.  GENERAL:  The patient is oriented to person, time, and place.  Affect is  normal.  HEENT:  Reveals no xanthelasma.  She has normal extraocular motion.  There are no carotid bruits.  There is no jugular venous distention.  LUNGS:  Clear.  Respiratory effort is not labored.  CARDIAC:  Reveals S1 and S2.  There are no clicks or significant  murmurs.  ABDOMEN:  Soft.  She has no peripheral edema.   PROBLEMS:  1. Coronary disease.  She is status post CABG.  Her LIMA atretic.  Her      last cath was in May 2008.  2. Normal LV function.  3. History of peripheral vascular disease as previously outlined.  4. Dyslipidemia, treated.  5. COPD with a history of tobacco that is stopped.  6. Hypertension treated.  7. History of a remote TIA.  8. History of  asymptomatic sinus bradycardia.   The patient is doing well.  Her cath in 2008 revealed that her LIMA was  atretic.  She has a 60% left main that we always keep in mind.  Her  grafts to her right and circumflex were patent.  It was felt that the  left main was not causing ischemia to the LAD.  If she has problems in  the future,  we will consider re-cath and consider IVUS of the left main to see if an  intervention needs to be done in this area.  She is stable.  I will see  her back in 6 months.     Luis Abed, MD, [facility]  Electronically Signed    JDK/MedQ  DD: 11/28/2007  DT: 11/29/2007  Job #: [id number]   cc:   Kirstie Peri, MD

## 2010-09-08 NOTE — Assessment & Plan Note (Signed)
[facility]                          [facility]   Linda [name], Linda [name]                     MRN:          [id number]  DATE:08/05/2008                            DOB:          04-25-1941    Linda [name] is seen for cardiology followup.  I saw her last in August  2009.  My last extensive note was on October 10, 2007.  We had been  watching her very carefully.  We know that she has residual disease  after her CABG.  We also know that she has a sensation in her throat  from time to time that we thought might be ischemia.  She has not had  much of this.  She did have 1 episode about approximately 2 weeks ago  after eating of an unusual sensation in her chest that lasted about 30  seconds.  Since then, she has been completely stable.   PAST MEDICAL HISTORY:   ALLERGIES:  No known drug allergies.   MEDICATIONS:  See the flow sheet.   REVIEW OF SYSTEMS:  She has no fevers or chills.  There are no  headaches.  There are no skin rashes.  She is not having any change in  her vision or hearing.  She has no cough.  There is no GI or GU  symptoms.  She has no swelling.  All other systems are reviewed and are  negative.   PHYSICAL EXAMINATION:  VITAL SIGNS:  Blood pressure is 150/80.  Her  pressure is always higher than her home pressures.  Her pressure at home  is always in the 120s-130s.  Heart rate is 59.  Weight is 146.  GENERAL:  The patient is oriented to person, time, and place.  Affect is  normal.  HEENT:  No xanthelasma.  She has normal extraocular motion.  NECK:  There are no carotid bruits.  There is no jugular venous  distention.  LUNGS:  Clear.  Respiratory effort is not labored.  CARDIAC:  An S1 with an S2.  There are no clicks or significant murmurs.  ABDOMEN:  Soft.  EXTREMITIES:  She has no peripheral edema.   Problems are listed on my note of November 28, 2007.  She is stable other  than some recent very brief chest discomfort.   It has now been 1 year  since her last Myoview scan and her last Cardiolite scan.  This will be  arranged.  She has known coronary disease with her last cath in May 2008  and her LIMA is atretic.  An adenosine Cardiolite scan will be done.  I  will be in touch with her with the result.  Otherwise, I will see her  back in 6 months.    Luis Abed, MD, [facility]  Electronically Signed   JDK/MedQ  DD: 08/05/2008  DT: 08/06/2008  Job #: [id number]   cc:   Kirstie Peri, MD

## 2010-09-08 NOTE — Assessment & Plan Note (Signed)
[facility]                          [place] [facility] NOTE   Linda [name], Linda [name]                     MRN:          [id number]  DATE:10/17/2006                            DOB:          12/27/1940    PRIMARY CARDIOLOGIST:  Linda [name].   REASON FOR VISIT:  Post catheterization followup.   Linda [name] is a 70 year old female, well known to Linda [name] with  longstanding history of coronary artery disease status post CABG and  subsequent multiple percutaneous interventions, who was recently  referred for an elective cardiac catheterization, per Linda [name].   This most recent catheterization by Linda [name] suggested stable  left main disease with 60% distal stenosis with additional evaluation by  IVUS ( area of stenosis to 65%). This was deemed to represent moderate  LMCA disease in the setting of widely patent grafts to the CFX and RCA,  50% in stent restenosis of the ostial SVG-OM graft, and widely patent  stents of the mid LAD native artery. Therefore, continued medical  therapy was recommended. Left ventricular function was normal (ejection  fraction 65%) with no wall motion abnormalities.   Of note, the patient's previous catheterization was earlier this year,  in February, at which time Linda [name] indicated a 50%-60% stenosis of  the left main artery, with question of perhaps slight progression from  the previous study. The results of this study were reviewed by Linda [name]  here in the clinic at time of scheduled follow up. Of note, the LIMA-LAD  graft was found to be atretic at time of catheterization in February.   Clinically, patient tells me today that, in fact, she has not had any  exertional symptoms since her last percutaneous intervention. Although  she does have occasional chest burning or throat discomfort, these are  not related to any specific activity or exertion.   At time of last clinic visit with Dr.  Andee [name], patient had up titration  of Imdur from 60 to 90. She tells me today that this has not provided  any significant change in her symptomatology, but has resulted in some  slight worsening of her headaches.   CURRENT MEDICATIONS:  1. Plavix.  2. Low dose aspirin.  3. Fish oil.  4. Vytorin 10/40 daily.  5. Norvasc 2.5 daily.  6. Toprol  XL 75 daily.  7. Imdur 90 daily.   PHYSICAL EXAMINATION:  Blood pressure 150/82, pulse 56, regular, weight  143.  GENERAL: A 70 year old female sitting upright in no distress.  HEENT: Normocephalic, atraumatic.  NECK: Palpable bilateral carotid pulses without bruits; no JVD.  LUNGS: Clear to auscultation all fields.  HEART: Regular rate and rhythm (S1, S2). No significant murmurs.  ABDOMEN: Soft, nontender.  EXTREMITIES: Bilateral groins without hematoma, ecchymosis, and soft  bilateral bruits; intact distal pulses without edema.  NEURO: No focal deficit.   IMPRESSION:  1. Coronary artery disease- stable.      a.     Moderate left main stenosis by recent cardiac       catheterization: Medical therapy recommended.      b.  Continued wide patency of SVG grafts to the CFX and RCA;       widely patent native LAD stent, and 50% in stent restenosis of SVG-       obtuse marginal graft; known atretic LIMA- LAD graft.      c.     Normal left ventricular function.      d.     Status post CABG 2002.  2. Peripheral vascular disease.      a.     A 80% right renal artery stenosis.      b.     A 70% right common iliac stenosis.  3. Dyslipidemia.  4. Chronic obstructive pulmonary disease/ history of tobacco.  5. Hypertension.  6. Gastroesophageal reflux disease.  7. Remote TIA.   PLAN:  1. Continued aggressive medical management for known coronary artery      disease, as well as better blood pressure control. Therefore, I      recommend increasing Norvasc from 2.5 to 5 daily.  2. Patient would like to stop taking Vytorin, in light of recent       reports about Zetia, and we will therefore substitute this with      simvastatin 40 at bedtime. We will schedule a follow up fasting      lipid/liver profile in 3 months.  3. Schedule return clinic follow up with myself and Linda [name]      in 3 months.     Linda Serpe, PA-C  Electronically Signed    GS/MedQ  DD: 10/17/2006  DT: 10/17/2006  Job #: [id number]

## 2010-09-08 NOTE — Assessment & Plan Note (Signed)
[facility]                          Linda [name]   Linda [name], Linda [name]                     MRN:          [id number]  DATE:09/05/2007                            DOB:          1940-08-23     Linda [name] is doing well.  She had been hospitalized recently with some  chest discomfort.  There was no evidence of an MI.  We did proceed with  a Cardiolite scan.  There was no obvious ischemia.  She was stable and  she was discharged home.  I recommended to her that she use some support  hose for significant edema on a p.r.n. basis.  She has obtained some and  she uses it only if she is going to be in a position of having her feet  dependent for prolonged periods of time.   She does have headaches.  There has been a fine line between using the  anti-anginal meds that we need and between her headaches.  She says that  she has not been able to take ibuprofen in the past.  She says Tylenol  does not help significantly.  I have decided to lower her Imdur dose.  This is a difficult call because of her chest pain as opposed to her  headaches.  She is stable at this time.   PAST MEDICAL HISTORY:   ALLERGIES:  NO KNOWN DRUG ALLERGIES.  (INTOLERANCE TO IBUPROFEN PER THE  PATIENT).   MEDICATIONS:  1. Plavix 75.  2. Aspirin 81.  3. Fish oil.  4. Norvasc 5.  5. Imdur 90 (to be reduced to 60).  6. Toprol XL 50.  7. Crestor 10 (to be increased to 20.   OTHER MEDICAL PROBLEMS:  See the complete list on my [name] of March 03, 2007.   REVIEW OF SYSTEMS:  She is doing well.  Other than the HPI, her review  of systems is negative.   PHYSICAL EXAMINATION:  VITAL SIGNS:  Blood pressure is 142/82 with a  pulse of 58.  Weight is 143 pounds.  GENERAL:  The patient is oriented to person, time and place.  Affect is  normal.  HEENT:  Reveals no xanthelasma.  She has normal extraocular motion.  NECK:  There are no carotid bruits.  There is no  jugulovenous  distention.  LUNGS:  Clear.  Respiratory effort is not labored.  CARDIAC:  Reveals S1-S2.  There are no clicks or significant murmurs.  ABDOMEN:  Soft.  EXTREMITIES:  She has only trace peripheral edema at this time.   Since being her last on Aug 25, 2007, her cholesterol was checked.  Cholesterol was 161 with an HDL of 39, LDL of 96.  With this in mind, we  will increase her Crestor.  We had a careful discussion about this and  she understands the rationale.   Problems are listed completely on the [name] of March 03, 2007.   #1.  Coronary disease.  She is stable.  Her most recent Cardiolite scan  revealed no significant abnormalities.  #2.  Normal left ventricular function.  #8.  Headaches.  The plan will be to reduce her Imdur to 60 and increase her Crestor to  20.     Linda Abed, MD, [facility]  Electronically Signed    JDK/MedQ  DD: 09/05/2007  DT: 09/05/2007  Job #: [id number]   cc:   Kirstie Peri, MD

## 2010-09-11 NOTE — Assessment & Plan Note (Signed)
[facility]                            CARDIOLOGY OFFICE NOTE   RANDA, RISS                     MRN:          [id number]  DATE:07/04/2006                            DOB:          [birth year]    Ms. Farro is seen in cardiology followup. I follow her at the [place]  office. I am seeing her today in the [place] office. She had been  seen after me by Dr. Andee Lineman and he very appropriately arranged for  cardiac catheterization. This was done by Dr. Riley Kill on June 17, 2006. The patient is stable. She has problems with her LIMA in that it  is atretic and diseased. There is a 50-60% focal narrowing of the left  main and this may be providing ischemia, although it appears stable.  There is slow filling distal to the circumflex stent. Consideration will  be given to nuclear imaging to assess distribution of her LAD. We will  decide about this over time.   At this point, she continues to have some symptoms that I believe are  most probably GI in origin. They are not with exertion. Sometimes she  becomes anxious with it. She describes sensation of burning and  sometimes when she is hungry. Also, there may be an upper chest  component, but this is less frequent. This may be angina.   PAST MEDICAL HISTORY:   ALLERGIES:  SULFA.   MEDICATIONS:  1. Plavix 75.  2. Toprol XL 50.  3. Aspirin 81.  4. Fish oil.  5. Vytorin 10/40.  6. Imdur 30.  7. Prilosec over-the-counter.  8. Norvasc 2.5.   OTHER MEDICAL PROBLEMS:  See the list below.   REVIEW OF SYSTEMS:  See the HPI above.   PHYSICAL EXAMINATION:  Blood pressure is 137/79 with a pulse of 67. The  patient is oriented to person, time and place and her affect is normal.  HEENT: There is  no xanthelasma. There is normal extraocular motion.  There are no carotid bruits. There is no jugular venous distention.  CARDIAC: Reveals an S1, with an S2. There are no clicks or significant  murmurs.  ABDOMEN: Soft. There are no masses or bruits.  There is no peripheral edema.   See the cath data as outlined.   I believe Ms. Bezek is stable. She has coronary disease as outlined.  See the cath report. We will proceed at some point with her Myoview  scan. She has no insurance and this will be very difficult for her at  this time. I will have her take Prilosec over-the-counter twice a day  and not eat after night and I will see her back in the office on March  the 27th.     Luis Abed, MD, [facility]  Electronically Signed    JDK/MedQ  DD: 07/04/2006  DT: 07/04/2006  Job #: [id number]   cc:   Kirstie Peri, MD

## 2010-09-11 NOTE — H&P (Signed)
NAME:  Linda [name], WALTHERS NO.:  [id number]   MEDICAL RECORD NO.:  [id number]                   PATIENT TYPE:  INP   LOCATION:  2033                                 FACILITY:  [facility]   PHYSICIAN:  Arturo Morton. Riley Kill, M.D. [facility]         DATE OF BIRTH:  [birth year]   DATE OF ADMISSION:  12/30/2001  DATE OF DISCHARGE:                                HISTORY & PHYSICAL   CHIEF COMPLAINT:  Chest pain.   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  In December of this year she underwent  a revascularization surgery by Dr. Kathlee Nations Trigt with the left internal  mammary to the LAD, saphenous vein graft to the OM, and saphenous vein graft  to the right coronary artery.  She had noticed a tingling and some  discomfort in the chest, but had generally ignored this until about one week  ago, when she noticed that it became reproducible with activity.  At first  she thought she was imagining this, but with time recognized that it was a  reliable and reproducible symptom complex.  There was throat discomfort  associated with this.  She was seen by Dr. Wende Crease yesterday, and  admitted to [facility] for further evaluation.  Following bypass,  the patient had an atrial flutter, requiring cardioversion.  She also had  evaluation revealing renal artery stenosis as well as some iliac disease,  and has been seen by Dr. Veneda Melter as an outpatient, and treated  conservatively.  Fortunately she stopped smoking in December of this past  year.   ALLERGIES:  MACRODANTIN AND SULFA.   MEDICATIONS:  1. Toprol XL 75 mg q.d.  2. Hydrochlorothiazide 25 mg q.d.  3. Enteric-coated aspirin 325 mg q.d.  4. Added to the regimen included Imdur 60 mg q.d.  5. Lovenox.  (Plavix had been stopped.)   PAST MEDICAL HISTORY:  1. Hypotension.  2. TIA in March of this year.  3. Gastroesophageal reflux disease.  4. Coronary artery disease.  5. Hyperlipidemia.   SOCIAL HISTORY:  The patient lives in [place].  She is married and has two  sons.  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  She quit smoking in December of  this past year.   FAMILY HISTORY:  Positive for premature coronary artery disease.   REVIEW OF SYSTEMS:  Remarkable for chest pain and throat discomfort.  Otherwise negative.   PHYSICAL EXAMINATION:  GENERAL:  She is alert and oriented.  VITAL SIGNS:  Temperature 98.4 degrees, pulse 58, respirations 20, blood  pressure 130/70.  Saturation is 93% on room air.  NECK:  There are no carotid bruits.  LUNGS:  Fields are clear to auscultation and percussion.  HEART:  The cardiac rhythm is regular.  Distal pulses intact.  ABDOMEN:  Soft.   LABORATORY DATA:  No chest x-ray is obtained.  ELECTROCARDIOGRAM:  Reveals a normal sinus rhythm with anterolateral ST-T  wave changes.  Troponin and CPK at 7:30 a.m. this morning were normal.  Glucose 106, BUN  17, creatinine 1.1.  Potassium 3.5.  Pro time 12.1.  Hemoglobin 12.7,  hematocrit 37, platelet count 253,000, white blood cell count 7200.   IMPRESSION:  1. Recurrent exertional substernal chest pain, following revascularization     surgery.  2. Evidence of peripheral vascular disease.  3. Prior smoking history.  4. Hypotension.  5. Hyperlipidemia.  6. Prior tobacco use.  7. History of transient ischemic attack.  8. MACRODANTIN AND SULFA allergies.   PLAN:  The patient has been transferred for a cardiac catheterization.  The  definition of the anatomy would be important.  Importantly, there is  potential that she could have narrowing at the distal insertions, given the  time frame of recurrent symptomatology.  She denies smoking since she has  had her bypass surgery.  She is agreeable to a cardiac catheterization.  We  will plan to proceed on Monday.  In the interim she will be treated with  antiplatelet agents as well as low molecular weight  heparin.                                                  Arturo Morton. Riley Kill, M.D. [facility]    TDS/MEDQ  D:  12/30/2001  T:  01/01/2002  Job:  [id number]   cc:   Wende Crease, M.D.

## 2010-09-11 NOTE — Assessment & Plan Note (Signed)
[facility]                          [place] [facility]   YAFFA, SECKMAN                     MRN:          [id number]  DATE:06/08/2006                            DOB:          10/05/1940    Ms. Levay has known coronary disease.  Recently, she has been having  some chest burning.  It is not exertional.  I am not convinced yet that  it represents angina.  She might have some reflux.  She has not had this  as a problem before.   Last evening she had a drink of water and felt the sensation in her  neck, and she was concerned that this might be cardiac.  These are  similar to the type of symptoms she has had in the past.   The patient does have coronary disease.  She is post CABG in December  2003, and prior to that she had several interventions.  She did have a  Cardiolite scan in June 2006.  We will strongly consider repeating it.  Unfortunately, at this time she has no insurance, and we will try to  proceed in a fashion that is safe for her without extra expenses if  possible.   PAST MEDICAL HISTORY:   ALLERGIES:  1. MACRODANTIN.  2. SULFA.   MEDICATIONS:  1. Plavix 75.  2. Toprol XL 50.  3. Aspirin 81.  4. Fish oil.  5. Vytorin 10/40.  6. Norvasc 2.5 mg daily.   OTHER MEDICAL PROBLEMS:  See the list below.   REVIEW OF SYSTEMS:  She has had varied aches and pains.  Otherwise, her  review of systems is negative.   PHYSICAL EXAMINATION:  Blood pressure today is 150/87.  Pulse is 64.  The patient is oriented to person, time and place.  Affect is normal.  She has no xanthelasma.  She has normal extraocular motion.  There are no carotid bruits.  There is no jugular venous distention.  CARDIAC EXAM:  Reveals and S1 with an S2.  There are no clicks or  significant murmurs.  ABDOMEN:  Soft.  She has no masses or bruits.  She has no significant peripheral edema.   EKG reveals old nonspecific ST-T wave changes.    PROBLEMS:  1. History of gastroesophageal reflux disease.  She does not remember      this, but there is a history, and I do believe she some needs some      medicines for this to see how she feels.  2. Smoking history that stopped in December 2002.  3. History of transient ischemic attack 11 years ago.  4. Chronic obstructive pulmonary disease.  5. Hives from Macrodantin in the past.  6. Allergy to sulfa.  7. Hyperlipidemia.  She has been on Vytorin.  Because of recent data,      we can consider switching her to simvastatin over time.  8. Coronary artery disease.  The patient had coronary artery bypass      graft in 2002.  She had brachytherapy in 2003, and she had stent to      a  vein graft restenosis in the area of brachytherapy.  She has also      had dilatation of distal anastomosis of the vein graft to the      obtuse marginal.  We keep her on Plavix indefinitely.  9. An 80% right renal artery stenosis.  We will have to keep this in      mind very carefully.  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  She has no significant      claudication.   With her current symptoms, I am concerned for her.  As of today, we will  restart Imdur and have her seen back very carefully.  We will also put  her on a PPI to see if there is any reflux component to this.  Argument  could be made for proceeding rapidly to a Cardiolite or heart  catheterization.  To start with, I am willing to try to adjust her  medicines as we can and see her back.     Luis Abed, MD, [facility]  Electronically Signed    JDK/MedQ  DD: 06/08/2006  DT: 06/08/2006  Job #: [id number]

## 2010-09-11 NOTE — Discharge Summary (Signed)
NAME:  MEHAR, SAGEN NO.:  [id number]   MEDICAL RECORD NO.:  [id number]                   PATIENT TYPE:  INP   LOCATION:  6532                                 FACILITY:  [facility]   PHYSICIAN:  Jonelle Sidle, M.D. [facility]        DATE OF BIRTH:  04/04/1941   DATE OF ADMISSION:  12/30/2001  DATE OF DISCHARGE:  01/04/2002                                 DISCHARGE SUMMARY   DISCHARGE DIAGNOSES:  1. Coronary artery disease, status post cardiac catheterization with     percutaneous coronary intervention this admission, status post bypass     surgery in December of 2002.  2. History of transient ischemic attack.  3. Gastrointestinal reflux disease.  4. Hyperlipidemia.   HISTORY OF PRESENT ILLNESS:  The patient is a 70 year old female who had  undergone bypass surgery by Kerin Perna, M.D., in December of 2002.  She  saw Wende Crease, M.D., on December 29, 2001, and was admitted to [facility] for further evaluation.  She was subsequently transferred to [facility] for cardiac catheterization.   HOSPITAL COURSE:  She was seen and admitted by Arturo Morton. Riley Kill, M.D.  Dr.  Riley Kill felt that the patient's symptoms were consistent with unstable  angina.  He started her on antiplatelet agents, as well as Lovenox and  planned for cardiac catheterization.  On January 01, 2002, the patient was  taken to the catheterization lab by Veneda Melter, M.D.  Catheterization  Results:  1.  Left main coronary artery:  Distal 50%.  2. Left anterior  descending coronary artery:  70% mid vessel followed by 40% distal, apical  filling via the LIMA.  3.  Left circumflex:  100% lesion in the AV  circumflex, saphenous vein graft to the OM, 70% stenosis at the anastomosis.  4.  Right coronary artery:  100% occluded proximally, distally filled via  saphenous vein graft.  5.  LIMA to LAD patent, small, no competitive flow.  6.  Saphenous vein graft to  left circumflex:  90% occluded in the proximal  portion.  7.  Supraventricular tachycardia to the right coronary artery:  40-  50% lesion proximally.  8. Left ventriculogram:  Ejection fraction greater  than 55% with no mitral regurgitation.  Veneda Melter, M.D., felt that the  patient's bypass grafts were beginning to fail.  He then proceeded to  perform an angioplasty and stenting on the lesion in the mid portion of the  LAD.  The 70% lesion was reduced down to 0%.  This was covered with a Cypher  stent.  He then performed angioplasty and stenting to the lesion in the AV  circumflex.  This was reduced down to less than 10% and covered with an  Express 2 stent.  Both vessel had resulting TIMI-3 flow with no vessel  damage.  He then planned for a staged intervention on the saphenous vein  graft to  the OM1.   On January 03, 2002, the patient was taken back to our catheterization lab  by Arturo Morton. Riley Kill, M.D.  Dr. Riley Kill then performed an angioplasty and  stenting to the lesion in the saphenous vein graft to the obtuse marginal.  This was covered with an Express 2 stent.  The lesion was reduced down to  less than 10% with resulting TIMI-3 flow.  There was noted to be  approximately 50% stenosis in the distal anastomosis, however, treatment was  deferred due to excellent runoff.   The next day, the patient was seen by Salvadore Farber, M.D.  The patient  was doing well and he felt that she was ready for discharge.   DISCHARGE MEDICATIONS:  1. Enteric-coated aspirin 325 mg q.d.  2. Plavix 75 mg q.d. for the next three months.  3. Zocor 40 mg q.h.s.  4. Fosamax q.d.  5. Toprol XL 50 mg q.d.  6. Sublingual nitroglycerin as needed.   LABORATORY DATA:  Sodium 141, potassium 3.9, chloride 106, CO2 27, BUN 8,  creatinine 1.0, glucose 107.  White count 6.7, hemoglobin 11.2, hematocrit  15.8, platelets 224.  Total cholesterol 229, triglycerides 396, LDL 115,  ELDL 79, total cholesterol to  HDL ratio 6.5.  C-reactive protein 8.1.   The electrocardiogram showed sinus bradycardia at 56 with some anterolateral  ST-T wave changes, PR interval 188, QRS 92, QTC 417, and axis 87.   ACTIVITY:  The patient is to avoid driving, heavy lifting, or tub baths for  two days.   DIET:  She is to follow a low-fat, low-cholesterol diet.   WOUND CARE:  She is to watch the catheterization site for any pain,  bleeding, or swelling and to call the [place] office with any of these  problems.   FOLLOW UP:  She is to follow up with Jonelle Sidle, M.D., in the [place],  [place], office on January 22, 2002, at 2 p.m.  She is to follow up  with Wende Crease, M.D., as needed or as scheduled.     Annett Fabian, P.A. [facility]                  Jonelle Sidle, M.D. [facility]    CKM/MEDQ  D:  01/04/2002  T:  01/06/2002  Job:  [id number]   cc:   Wende Crease, M.D.

## 2010-09-11 NOTE — Op Note (Signed)
[place]. [facility]  Patient:    Linda [name], Linda [name] Visit Number: [id number] MRN: [id number]          Service Type: MED Location: 2300 [id number] Attending Physician:  Mikey Bussing Dictated by:   Mikey Bussing, M.D. Proc. Date: 04/06/01 Admit Date:  03/31/2001   CC:         [facility]   Operative Report  PREOPERATIVE DIAGNOSIS:  Class 4 unstable angina with severe three-vessel coronary artery disease.  POSTOPERATIVE DIAGNOSIS:  Class 4 unstable angina with severe three-vessel coronary artery disease.  OPERATION PERFORMED:  Coronary artery bypass grafting x 3 (left internal mammary artery to left anterior descending, saphenous vein graft to obtuse marginal, saphenous vein graft to right coronary artery).  SURGEON:  Mikey Bussing, M.D.  ASSISTANT:  Loura Pardon, P.A.  ANESTHESIA:  General by Dr. Arta Bruce.  INDICATIONS FOR PROCEDURE:  The patient is a 70 year old female smoker with symptoms of unstable angina.  She was admitted and ruled out for myocardial infarction and underwent cardiac catheterization.  This demonstrated severe three-vessel coronary artery disease with 90% stenosis of the LAD, circumflex and right coronary artery.  Her ventricular systolic function was preserved. She was referred for surgical coronary revascularization.  Prior to the operation, I examined the patient in her hospital room and reviewed the results of the cardiac catheterization with the patient.  I discussed the indications and expected benefits of coronary artery bypass grafting.  I discussec the alternatives to surgical therapy for treatment of her coronary artery disease.  I discussed the major aspects of the operation proposed including the location of the surgical incisions, the use of general anesthesi and cardiopulmonary bypass, the choice of conduit, and the expected postoperative recovery.  I reviewed with the patient the risks to  her of coronary artery bypass surgery including the risks of MI, CVA, bleeding, infection, Prolonged ventilator dependence, and death.  She understood these aspects of the operation and agreed to proceed with the operation as planned under informed consent.  OPERATIVE FINDINGS:  The patients short stature made exposure of the posterior aspect of the heart difficult.  The ascending aorta was short.  The lungs had significant chronic obstructive pulmonary disease--emphysema.  The coronaries were small vessels.  The mammary artery was small but had adequate flow. The veins were small.  The vein in the right lower extremity was inadequate for use and vein was harvested from the left lower extremity.  DESCRIPTION OF PROCEDURE:  The patient was brought to the operating room and placed supine on the operating room where general anesthesia was induced under invasive hemodynamic monitoring.  The chest, abdomen and legs were prepped with Betadine and draped as a sterile field.  A median sternotomy was performed as the saphenous vein was harvested from the legs.  The internal mammary artery was harvested as a pedicle graft from its origin at the subclavian vessels.  Heparin was administered and the ACT was documented as being therapeutic.  A sternal retractor was placed.  Pursestrings were placed in the ascending aorta and right atrium and the patient was cannulated and placed on bypass and cooled to 32 degrees.  The coronaries were identified for grafting.  The mammary artery and vein  grafts were prepared for the distal anastomoses and a cardioplegia cannula was placed.  The patient was cooled to 28 degrees and as the aortic crossclamp was applied, 450 cc of cold blood cardioplegia was delivered to  the aortic root with immediate cardioplegic arrest and septal temperature dropping less than 12 degrees.  Topical iced saline slush was used to augment myocardial preservation and a  pericardial insulator pad was used to protect the left phrenic nerve.  The distal coronary anastomoses were then performed.  The first distal anastomosis was to the right coronary artery.  This was a 1.2 mm vessel with proximal 90% stenosis.  A reversed saphenous vein was sewn end-to-side with running 7-0 Prolene.  The second distal anastomosis was to the obtuse marginal.  This was a 1.4 mm vessel with proximal 90% stenosis.  A reversed saphenous vein was sewn end-to-side with running 7-0 Prolene and there was good flow through the graft.  The third distal anastomosis was to the distal aspect of the LAD.  It was intramyocardial more proximally.  The left internal mammary artery pedicle was brought through an opening created in the left lateral pericardium, was brought down on the LAD and sewn end-to-side with a running 8-0 Prolene.  There was excellent flow through the anastomosis with immediate rise in septal temperature after release of the pedicle clamp on the mammary artery.  The mammary pedicle was secured to the epicardium and the aortic crossclamp was removed.  Two proximal vein anastomoses were placed on the ascending aorta using a partial occluding clamp.  The clamp was removed and the vein grafts were perfused.  Each had excellent flow and hemostasis was documented in the proximal and distal anastomoses.  The patient was rewarmed to 37 degrees.  Temporary pacing wires were applied.  The lungs were re-expanded and the ventilator was resumed.  The patient was then weaned from bypass without difficulty with stable blood pressure and cardiac output. Protamine was administered and the cannulas were removed.  The mediastinum was irrigated with warm antibiotic irrigation.  The leg incision was irrigated and closed in a standard fashion.  The pericardium was loosely reapproximated superiorly over the aorta and right ventricle.  The chest tubes were placed to drain the mediastinum and  left pleural space.  The sternum was reapproximated  with interrupted steel wire.  The pectoralis fascia was closed with a #1 Vicryl.  The subcutaneous and skin were closed with a running Vicryl and sterile dressings were applied.  Total bypass time was 100 minutes with aortic crossclamp time of 40 minutes. Dictated by:   Mikey Bussing, M.D. Attending Physician:  Mikey Bussing DD:  04/06/01 TD:  04/06/01 Job: [id number]

## 2010-09-11 NOTE — H&P (Signed)
NAME:  KENDLE, TURBIN NO.:  [id number]   MEDICAL RECORD NO.:  [id number]          PATIENT TYPE:  EMS   LOCATION:  MAJO                         FACILITY:  [facility]   PHYSICIAN:  Arvilla Meres, M.D. LHCDATE OF BIRTH:  15-Oct-1940   DATE OF ADMISSION:  09/26/2004  DATE OF DISCHARGE:                                HISTORY & PHYSICAL   PHYSICIANS:  Primary care physician:  Wende Crease, M.D., [facility].  Cardiologist: Willa Rough, M.D.   PATIENT IDENTIFICATION:  Mrs. Hulen is a delightful 70 year old woman with  a history of severe CAD status post three-vessel CABG in December 2002 and  several percutaneous interventions since that time, most recently in  December 2003.  She was admitted through the ER with atypical arm pain for  rule-out MI.   According to the records, Ms. Kaylor underwent CABG in December 2003 for  severe three-vessel native coronary disease.  She had a LIMA to the LAD,  saphenous vein graft to the OM, and saphenous vein graft to the RCA.  She  was admitted in September 2003 with unstable angina which was manifest as  shortness of breath and throat pain.  Cardiac catheterization showed that  the LIMA was small and essentially atretic.  There was also evidence of  severe stenosis in the saphenous vein graft to the OM with a 90% proximal  lesion.  There was moderate disease in the saphenous vein graft to the RCA  with a 50% proximal lesion and a 50 to 60% lesion at the anastomosis.  At  that time, she underwent three-vessel stenting with percutaneous coronary  intervention to 1) the native left circumflex; 2) a 70% native LAD; and 3)  saphenous vein graft to the OM.  This was done in staged fashion.  She did  well with this procedure.  Unfortunately, she was readmitted December 2003  with unstable angina.  Cardiac catheterization at that time showed a 99% in-  stent restenosis of the saphenous vein graft to the OM and an 80% in-stent  restenosis of the  native left circumflex.  The saphenous vein graft to the  OM lesion was treated with a cutting balloon and brachytherapy.  The lesion  in the native left circumflex was treated with angioplasty and stenting with  a Cypher drug-eluting stent.   She did well since December 2003.  Reportedly she had some atypical chest  pain in February of this year and had a negative Cardiolite at [place] at  that time.  She has continued to do extremely well, being very active,  without anginal complaint.  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  Over the past two days, she has had 6 to  7 episodes of fleeting left arm pain and tingling without any other  symptoms.  These episodes last just seconds.  They can occur at any time and  are not reproducible with exertion.  These are very different from her  typical angina.  She called her primary care physician and was told to come  to the ER.  REVIEW OF SYSTEMS:  She denies any other complaints.  She has not had any  heart failure symptoms.  She has not had any exertional shortness of breath.  There has been no change in her exercise tolerance.  All other systems are  negative.   PAST MEDICAL HISTORY:  1.  CAD as per HPI.      1.  Status post three-vessel CABG in December 2003 with LIMA to LAD,          saphenous vein graft to OM, and saphenous vein graft to RCA.      2.  September 2003 underwent percutaneous intervention on the native          left circumflex, native LAD, and saphenous vein graft to the OM.      3.  In December 2003, brachytherapy and cutting balloon angioplasty to a          99% in-stent restenosis of the saphenous vein graft to the OM, also          drug-eluting stent to in-stent restenosis of the native left          circumflex.  2.  TIA in March 2003.  3.  Hypertension.  4.  Hyperlipidemia.  5.  Gastroesophageal reflux disease.  6.  History of peripheral vascular disease.   CURRENT  MEDICATIONS:  1.  Vitorin 40/10.  2.  Plavix 75 mg a day.  3.  Toprol XL 50 mg daily.  4.  Aspirin 325 daily.  5.  Imdur 30 mg daily.  6.  Multivitamins.  7.  Fish oil.  8.  Foltx.   DRUG ALLERGIES:  She is allergic to [facility] and SULFA.   SOCIAL HISTORY:  [place].  She is  retired. She has a history of tobacco use but quit December 2002.  She does  not drink alcohol.   FAMILY HISTORY:  Significant for a family history of coronary disease.   PHYSICAL EXAMINATION:  GENERAL:  She is comfortable, lying flat in bed.  Respirations are unlabored.  VITAL SIGNS:  Temperature 97.6, blood pressure 131/74, heart rate 63.  She  is saturating 99% on room air.  HEENT:  Sclerae anicteric.  EOMI.  There is no xanthelasma.  Mucous  membranes are moist.  NECK:  Supple.  There is no JVD.  Carotids are 2+ bilaterally without any  bruits. There is no lymphadenopathy or thyromegaly.  CARDIAC:  Regular rate and rhythm without murmurs, rubs, or gallops.  LUNGS:  Clear to auscultation.  ABDOMEN:  Soft, nontender, nondistended.  There is no hepatosplenomegaly.  There is no bruit.  There are not masses.  EXTREMITIES:  Warm without any cyanosis, clubbing, or edema.  She has no  pain on palpation of the left upper extremity which does not hurt on  movement.  Femoral pulses are 2+ bilaterally without bruits.  DP pulses are  1+ bilaterally without any bruits.  NEUROLOGIC:  Affect is appropriate.  She is alert and oriented x 3.  Cranial  nerves are intact.  She is otherwise nonfocal.   LABORATORY DATA AND OTHER STUDIES:  Point-of-care lab show a hemoglobin of  14, hematocrit 42.  Sodium 139, potassium 3.9, chloride 107, bicarb 25, BUN  16, creatinine 1.0, glucose 82.  She has had two sets of negative point-of-  care cardiac markers with troponin of less than 0.05 on both sets.   EKG shows normal sinus rhythm with rate of 76.  There are minimal ST depression  inferiorly and laterally with biphasic T waves.  There is also  mildly prolonged QT interval.  In comparison to previous EKGs, the lateral T  wave changes are new compared to December 2003; however, they were present  on the EKG in September 2003.   ASSESSMENT AND PLAN:  Ms. Cunanan is a 70 year old woman with a history of  severe coronary artery disease who presents with atypical, fleeting arm pain  which is quite different from her typical angina.  Her EKG is slightly  changed from December 2003 but the same as September 2003.  Point-of-care  markers are negative x 2.  At this  point, I have a very low suspicion for myocardial ischemia, but given her  strong history, would observe her overnight.  She likely can be discharged  in the a.m. if her cardiac markers and EKG remain unchanged.  Would probably  recommend an outpatient Cardiolite.       DB/MEDQ  D:  09/26/2004  T:  09/27/2004  Job:  [id number]   cc:   Wende Crease, M.D.  Dianna Rossetti, M.D.   [facility] Cardiology office

## 2010-09-11 NOTE — Cardiovascular Report (Signed)
NAME:  Linda [name], Linda [name]                       ACCOUNT NO.:  [id number]   MEDICAL RECORD NO.:  [id number]                   PATIENT TYPE:  INP   LOCATION:  6532                                 FACILITY:  [facility]   PHYSICIAN:  Arturo Morton. Riley Kill, M.D. [facility]         DATE OF BIRTH:  December 29, 1940   DATE OF PROCEDURE:  01/03/2002  DATE OF DISCHARGE:                              CARDIAC CATHETERIZATION   INDICATIONS FOR PROCEDURE:  The patient is a pleasant 70 year old woman who  has previously undergone revascularization surgery.  She had difficulty with  the insertion site of the internal mammary to the distal LAD and also a high-  grade stenosis of the ostium of the vein graft to the obtuse marginal.  In  addition, there was some narrowing of the ostium of the vein graft to the  right and the distal right insertion but this appears relatively adequate.  She underwent implantation of a drug-eluting stent into the LAD which went  well and then Dr. Chales Abrahams also put a stent into the ostium of the circumflex.  There is mild to moderate disease of the left main; however, it does not  appear to be flow-limiting.  At this point, it was felt that the best option  also would be to try to preserve the vein graft to the obtuse marginal and I  discussed at length with the patient and her husband the options earlier.  The plan, after review with Dr. Maren Beach, was to stent the ostium of the  vein graft to the OM and possibly dilate the distal portion of the graft  insertion to the OM.  Preparations were made for this and the patient was  agreeable to proceed.   PROCEDURE:  Percutaneous stenting of the ostium of the saphenous vein graft  to the obtuse marginal.   DESCRIPTION OF PROCEDURE:  The patient was brought to the catheterization  lab and prepped and draped in the usual fashion.  Through an anterior  puncture, the left femoral artery was easily entered.  The patient had been  pretreated with Plavix and  therefore was given Angiomax or bivalirudin for  anticoagulation.  ACT was checked and was 313 seconds.  A JR4 guiding  catheter with sideholes was utilized to intubate the graft.  It was crossed  with a wire.  We initially dilated with a 2.0 x 15-mm Maverick balloon.  Following this, a 2.5 x 20-mm Express stent was placed at the ostium with  very, very careful manipulation to ensure complete coverage of the ostium  just outside the ring.  This was taken up to about 14 atmospheres.  There  was still a small dumbbell at the interface site or just inside, but because  of the interface with the aorta, it was felt that post dilatation to very  high pressures would probably be unwise at the present time.  The balloon  was subsequently removed.  Followup scout shots  were obtained.  There was,  at most, about 50% narrowing at the distal insertion and because there was  excellent flow down here, we had ultimately decided to leave this area alone  as there are now dual sources of flow to the distal vessel.  All catheters  were subsequently removed and the femoral sheath sewn into place.   ANGIOGRAPHIC DATA:  The ostium of the saphenous vein graft to the OM had  TIMI-1 flow and 95-99% narrowing prior to the procedure.  Following the  procedure, this was reduced to less than 10% residual luminal narrowing with  an excellent angiographic appearance.  The final angiographic result was  excellent.  At the distal  insertion, there was about 50% narrowing of the circumflex marginal just  beyond the graft insertion and we elected to defer dilatation of this area.   DISPOSITION:  The patient will be treated with aspirin and Plavix.  She will  need close followup.                                               Arturo Morton. Riley Kill, M.D. [facility]    TDS/MEDQ  D:  01/03/2002  T:  01/04/2002  Job:  [id number]   cc:   Milagros Reap, M.D. [facility]   Mikey Bussing, M.D.  [address]  [place]   [place] [address]  Fax: [phone]   Cardiac Catheterization Lab

## 2010-09-11 NOTE — Cardiovascular Report (Signed)
NAME:  Linda [name], Linda [name] NO.:  [id number]   MEDICAL RECORD NO.:  [id number]                   PATIENT TYPE:  INP   LOCATION:  2033                                 FACILITY:  [facility]   PHYSICIAN:  Veneda Melter, M.D. [facility]               DATE OF BIRTH:  23-Jul-1940   DATE OF PROCEDURE:  01/01/2002  DATE OF DISCHARGE:                              CARDIAC CATHETERIZATION   PROCEDURE PERFORMED:  1. Left heart catheterization.  2. Left ventriculogram.  3. Selective coronary angiography.  4. Selective angiography of the saphenous vein internal mammary bypass     grafts.   DIAGNOSES:  1. Native three-vessel coronary artery disease.  2. Atherosclerosis, saphenous vein bypass graft.  3. Normal left ventricular systolic function.   HISTORY:  The patient is a 70 year old white female with known coronary  artery disease who has undergone coronary artery bypass surgery in December  2002 who presents with crescendo, now unstable, angina.  The patient was  admitted to the hospital, stabilized medically, and she was brought to the  catheterization lab for further assessment.   TECHNIQUE:  Informed consent was obtained.  The patient was brought to the  catheterization lab.  A #6 French sheath was placed in the right femoral  artery using the modified Seldinger technique.  The #6 [place] and JR4  catheters were then used to engage the left and right coronary arteries and  selective angiography performed in various projections using manual  injections of contrast.  A JR4 catheter was then used to engage the two  saphenous vein and one internal mammary bypass grafts and again, selective  angiography was performed using manual injections of contrast.  Subsequently  a #6 French pigtail catheter was advanced to the left ventricle and left  ventriculogram performed using power injections of contrast.  At the  termination of the case, the catheters were removed and the  sheaths secured  in position.  The patient was transferred to the holding area in stable  condition.  Findings are as follows:   FINDINGS:  1. Left main trunk:  Large caliber vessel.  Distal narrowing of 40-50% that     extends into the proximal LAD.  2. LAD:  This is a large caliber vessel that provides two diagonal branches     in the mid section.  The LAD has a long tubular narrowing of 70% in the     mid section between the diagonal branches.  There is then mild narrowing     of 40% after the second diagonal branch.  The apical LAD has evidence of     anastomosis of the LIMA graft although no competitive flow was noted.     The two diagonal branches have mild irregularities.  3. Left circumflex artery:  This is a medium caliber vessel that consists of     a marginal branch in the mid section.  The AV circumflex is 100% occluded     prior to the bifurcation with this marginal branch.  Faint antegrade     collateral flow is noted.  The marginal branch fills from the saphenous     vein graft and has moderate disease of 60-70% at the anastomotic site.  4. The right coronary artery is dominant.  This vessel is occluded     proximally.  The distal section fills via saphenous vein graft and     consists of a posterior descending artery and a posterior ventricular     branch.  There is mild diffuse disease of 30-40% in the distal branch     vessels.  5. LIMA to the LAD:  This is patent; however, the graft is of small caliber     without competitive flow distally and is on the verge of becoming     atretic.  6. The saphenous vein graft to the marginal branch of the left circumflex     artery is patent.  There is a long tubular narrowing of 90% in the     proximal segment.  The mid and distal vessel has mild irregularities.  7. The saphenous vein into the right coronary artery is patent.  There is a     long tubular narrowing of 40-50% in the proximal segment.  There is also     further  narrowing of 50-60% at the distal anastomotic site.  8. LV:  Normal end systolic and end diastolic dimensions.  Overall left     ventricular function is well preserved.  Ejection fraction of greater     than 55%.  No mitral regurgitation.   PRESSURES:  1. LV pressure is 135/10.  2. Aortic is 135/66.  3. LVEDP equals 20.   ASSESSMENT/PLAN:  The patient is a 70 year old female with native three-  vessel coronary artery disease who has progression of disease involving a  saphenous vein graft to the circumflex artery and a left internal mammary  artery to the left anterior descending artery.  Percutaneous intervention of  the mid left anterior descending artery will be performed and consideration  given towards percutaneous intervention to the native circumflex versus the  saphenous vein bypass graft.                                               Veneda Melter, M.D. [facility]    NG/MEDQ  D:  01/01/2002  T:  01/01/2002  Job:  [id number]   cc:   Jonelle Sidle, M.D. [facility]   Wende Crease, M.D.

## 2010-09-11 NOTE — Assessment & Plan Note (Signed)
[facility]                          [facility]   Linda, [name]                     MRN:          [id number]  DATE:07/25/2006                            DOB:          05-Sep-1940    Linda [name] was seen for cardiology followup. I had seen her on June 08, 2006 and I was concerned about her symptoms but decided to follow  her. She was then seen back in the office on June 16, 2006 by Dr.  Andee Lineman who was more concerned about her and a catheterization was  arranged. The catheterization was done on June 17, 2006 by Dr.  Riley Kill. She had good LV function. 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 There was a 50% to 60% focal narrowing of her left main with  possible slight progression from the previous study. There was continued  patency of the stent in the mid LAD. She had diffuse disease of a  previously placed circumflex stent with slow filling distally and a  patent vein graft to the distal right coronary artery with insertion  disease. The patient was stable. Feeling was that we would proceed at  some point with potential exercise testing to assess flow in her LAD.  Since that time she has not having any significant symptoms. The feeling  was that if she were to have significant ischemia consideration could be  given to IVIS and possible stenting of the left main if there were to be  significant ischemia. However the lesion does not appear to be tight or  __________. It might be hemodynamically significant under stress and we  will follow this carefully.   I saw the patient back on July 04, 2006 in the [place] office. I  believe that there is a reflux component to her symptoms. I increased  her Prilosec to b.i.d.  and I talked with her about watching her diet  carefully and about not eating at night and lying down. She has done  this and in fact she is feeling better.   PAST MEDICAL HISTORY:   ALLERGIES:   MACRODANTIN AND SULFA.   MEDICATIONS:  Plavix, Toprol, aspirin, fish oil, Vytorin, Norvasc,  Imdur, and Prilosec.   OTHER MEDICAL PROBLEMS:  See the extensive problem list in the past.   REVIEW OF SYSTEMS:  She feels very well at this time.   PHYSICAL EXAMINATION:  VITAL SIGNS:  Blood pressure 130/80 with a pulse  of 76. The patient is oriented to person, time, and place and her affect  is normal.  LUNGS: Clear. Respiratory effort is not labored.  HEENT:  She has no xanthelasma. She has normal extraocular motion.  NECK:  There are no carotid bruits. There is no jugular venous  distension.  CARDIAC EXAM: Reveals an S1, with an S2. There are no clicks or  significant murmurs.  ABDOMEN: Soft.  EXTREMITIES:  She has no peripheral edema.   PROBLEMS:  Include;  1. Coronary artery disease, post coronary artery bypass grafting in      2002, brachy therapy in 2003 with a vein graft stenosis,  stent to a      vein graft due to restenosis and dilation of a distal anastomosis      of a vein graft in the past.  2. 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  3. Dyslipidemia.  4. ALLERGY TO SULFA.  5. Chronic obstructive pulmonary disease.  6. History of gastroesophageal reflux disease.  7. Smoking history that she stopped in 2002.   The patient requires indefinite Plavix. 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 There is no significant claudication.   We will watch and think very carefully about the potential ischemia in  her LAD distribution. At this point there will be no change in therapy.  She knows how to reach me at anytime. We will see her back in 6 months.     Luis Abed, MD, [facility]  Electronically Signed    JDK/MedQ  DD: 07/25/2006  DT: 07/25/2006  Job #: [id number]   cc:   Kirstie Peri, MD

## 2010-09-11 NOTE — Cardiovascular Report (Signed)
NAME:  Linda [name], Linda [name]                       ACCOUNT NO.:  [id number]   MEDICAL RECORD NO.:  [id number]                   PATIENT TYPE:  INP   LOCATION:  6532                                 FACILITY:  [facility]   PHYSICIAN:  Veneda Melter, M.D. [facility]               DATE OF BIRTH:  February 11, 1941   DATE OF PROCEDURE:  01/01/2002  DATE OF DISCHARGE:                              CARDIAC CATHETERIZATION   PROCEDURES PERFORMED:  1. Percutaneous transluminal coronary angioplasty and stent placement to the     mid left anterior descending.  2. Percutaneous transluminal coronary angioplasty and stent placement to the     arteriovenous circumflex.   DIAGNOSES:  1. Native three-vessel coronary artery disease.  2. Atherosclerosis saphenous vein bypass grafts.  3. Unstable angina.   HISTORY:  The patient is a 70 year old white female with a history of  coronary artery disease, who has undergone coronary artery bypass graft  surgery in December of 2002.  The patient presents now with recurrence of  chest discomfort and underwent catheterization earlier today showing severe  native three-vessel coronary artery disease with compromise of the vein  graft to the marginal branch and a small LIMA graft to the LAD, which was  ineffective. She presents for percutaneous intervention.   TECHNIQUE:  Informed consent had been obtained, the patient brought to the  cardiac catheterization lab, and an existing 6 French sheath was exchanged  for a 7 [place] sheath.  The patient had been pretreated with  ReoPro in the holding area and was given 300 mg of Plavix orally. She was  also given aspirin and then supplemented with heparin to maintain ACT of  greater than 250 seconds.  A 7 [place] guide catheter was introduced and  used to engage the left coronary artery and selective guide shots obtained.  This confirmed the presence of a high-grade narrowing of 70% between the two  diagonal branches in the mid LAD. A  0.014 inch extra-support wire was  advanced in the distal LAD after it was used to size the lesion, and a 2.5 x  23 mm Cypher stent introduced. This was carefully positioned under  cineangiography and deployed to the mid LAD at 10 atmospheres for 30  seconds. A 2.75 x 20 mm Quantum Maverick balloon was then introduced and  used to post-dilate the stent. Two inflations were performed within the  distal and proximal segments at 16 atmospheres for 30 seconds.  Repeat  angiography showed an excellent result with no residual stenosis, no distal  vessel damage and TIMI-3 flow through the LAD. Attention was then turned to  the AV circumflex, which had a severe narrowing of 70% at the ostium and a  further subtotal narrowing of 99% in the mid AV segment prior to the takeoff  of the major marginal branch. The extra-support wire was repositioned in the  distal marginal branch and the 2.5 x 12  mm Quantum Maverick balloon  introduced. This would not pass through the stenosis and inflation was  performed at the ostium, the circumflex at 2 atmospheres for 15 seconds. A  1.5 x 15 mm Maverick balloon was then introduced, and was successfully  advanced into the lesion and inflated at 10 atmospheres for 30 seconds.  A  2.5 x 12 mm Quantum Maverick balloon could not be advanced into the AV  circumflex and was used to further dilate the lesion at 8 and 14 atmospheres  for 30 and 60 seconds, respectively. Repeat angiography showed significant  improvement in the vessel lumen with residual disease of 30-40%. A 2.5 x 16  mm Express II stent was then introduced, carefully positioned in the AV  circumflex extending up to the ostium and deployed at 12 atmospheres for 30  seconds. The 2.5 mm Quantum Maverick balloon was then introduced and used to  post-dilate the distal and proximal segments of the stents at 16 atmospheres  for 30 seconds each.  Repeat angiography was then performed showing an  excellent result,  full coverage of the two lesions and no residual stenosis.  There was no distal vessel damage and brisk flow into the marginal branch.  This was deemed an acceptable result. The guide catheter was then removed  and the sheath secured in position. The patient tolerated the procedure well  and was transferred to the ward in stable condition.   FINAL RESULTS:  1. Successful percutaneous transluminal coronary angioplasty and stent     placement in the mid left anterior descending with reduction of 70%     narrowing to 0% with placement of a 2.5 x 23 mm Cypher stent dilated to     2.75 mm.  2. Successful percutaneous transluminal coronary angioplasty and stent     placement to the arteriovenous circumflex with reduction of sequential 70     and 99% narrowings to less than 10% with placement of a 2.5 x 16 mm     Express II stent.                                                  Veneda Melter, M.D. [facility]    NG/MEDQ  D:  01/01/2002  T:  01/02/2002  Job:  [id number]   cc:   Jonelle Sidle, M.D. [facility]   Elby Beck, M.D.

## 2010-09-11 NOTE — Cardiovascular Report (Signed)
Linda [name], Linda [name]                            ACCOUNT NO.:  [id number]   MEDICAL RECORD NO.:  [id number]                   PATIENT TYPE:  INP   LOCATION:  4733                                 FACILITY:  [facility]   PHYSICIAN:  Charlies Constable, M.D. [facility]              DATE OF BIRTH:  03/02/1941   DATE OF PROCEDURE:  03/29/2002  DATE OF DISCHARGE:                              CARDIAC CATHETERIZATION   PROCEDURE PERFORMED:  1. Cutting Balloon angioplasty in the saphenous vein graft of the marginal     branch to the circumflex artery with brachytherapy.  2. Stenting of the native obtuse marginal.   CLINICAL HISTORY:  The patient is 70 years old and has had previous bypass  surgery and multiple percutaneous interventions.  She was recently admitted  with unstable angina and was studied yesterday by Dr. Riley Kill and found to  have a tight in-stent re-stenosis in the vein graft to the circumflex  marginal system. The stent was located at the ostium of the vein graft.  This stent was placed in September of 2003.  She had a patent LAD with a  patent Cypher stent with no re-stenosis and an atretic mammary to that  vessel and she had a patent vein graft to the right and good LV function.   DESCRIPTION OF PROCEDURE:  The procedure was performed via the left femoral  artery because the patient had peripheral vascular disease on the right  side.  We went in just below Dr. Rosalyn Charters entry site.  After we obtained  access, the patient was given Angiomax bolus and infusion and had been on  Plavix. We initially used an AL-1 7 [place] guiding catheter with side holes.  We used a long luge wire and crossed the lesion in the proximal vessel  without difficulty. The lesion was so tight that we pre-dilated with a 2.0 x  20 mm Maverick and performed two inflations up to 8 atmospheres for 30  seconds.  We then used a 2.75 x 15 mm Cutting Balloon and performed a total  of four inflations up to 8 atmospheres for 30  seconds each.  We then  performed brachytherapy.  We used a 2.5 x 32 mm centering balloon.  Unfortunately, we were unable to pass the active wire into the centering  catheter. We felt this was due to bends in the Amplatz catheter so we  switched to a left bypass catheter.  We were then able to deliver the  radiation to the affected area.  We delivered a total of 20 Gy to the  affected area with a dwell time that was approximately 69 seconds.  We then  removed the centering catheter.   We then made a decision we needed to treat the distal lesion at the  anastomosis of the vein graft to the marginal vessel which was estimated at  80%.  We direct stented this  with a 2.5 x 8 mm Cypher stent deploying this  with one inflation of 11 atmospheres for 35 seconds and a second inflation  of 14 atmospheres with the balloon pulled inside the distal edge for 30  seconds.  We then touched up the lesion within the stent in the ostial  portion of the vein graft using a 2.75 x 20 mm Quantum Maverick and we  performed two inflations up to 16 atmospheres for 30 seconds.  Repeat  diagnostic studies were then performed through the guiding catheter.   RESULTS:  Initially the stenosis within the stent in the ostial portion of  the vein graft to the marginal branch of the circumflex artery was 99% with  TIMI-1 flow.  Following Cutting Balloon angioplasty, brachytherapy and touch-  up balloon angioplasty, the stenosis improved to 10% and the flow improved  from TIMI-1 to TIMI-3 flow.   The lesion at the distal anastomosis in the marginal vessel was initially  80% and following stenting this improved to 10%.   CONCLUSION:  1. Successful percutaneous coronary intervention for in-stent re-stenosis of     the stent in the ostial portion of the saphenous vein graft to the     marginal vessel using Cutting Balloon angioplasty and brachytherapy with     improvement in percent diameter narrowing from 99% to 10% and  improvement     in the flow from TIMI-1 to TIMI-3 flow.  2. Successful stenting of the lesion in the circumflex marginal vessel just     after the anastomosis with a Cypher stent with improvement in percent     diameter narrowing from 80% to 10%.   DISPOSITION:  The patient was returned to the postangioplasty unit for  further observation.                                                    Charlies Constable, M.D. [facility]    BB/MEDQ  D:  03/29/2002  T:  03/29/2002  Job:  [id number]   cc:   Willa Rough, M.D. [facility]   Elby Beck, M.D.   Cardiopulmonary Laboratory

## 2010-09-11 NOTE — Discharge Summary (Signed)
Linda [name], Linda [name]                            ACCOUNT NO.:  [id number]   MEDICAL RECORD NO.:  [id number]                   PATIENT TYPE:  INP   LOCATION:  6529                                 FACILITY:  [facility]   PHYSICIAN:  Gene Serpe, P.A. [facility]                DATE OF BIRTH:  05/10/1940   DATE OF ADMISSION:  03/27/2002  DATE OF DISCHARGE:                           DISCHARGE SUMMARY - REFERRING   PROCEDURES:  PTCA/brachy therapy, SVG-OM1 graft and stent OM, December 4.   REASON FOR ADMISSION:  Linda [name] is a 70 year old female, with known  coronary artery disease, status post CABG and recent percutaneous  intervention, followed by Dr. Willa Rough, who recently presented to the  [facility] for evaluation of recurrent dyspnea and neck tightness.  She was  admitted directly for management of unstable angina pectoris and re-look  coronary angiogram.  Please refer to dictated Admission Note for full  details.   LABORATORY DATA:  Cardiac enzymes normal.  CBC normal.  Metabolic profile  normal.  Liver enzymes normal.   Admission CXR:  No active disease.   HOSPITAL COURSE:  The patient was placed on Lovenox and continued on  aspirin, Plavix and Toprol.  Serial cardiac enzymes were normal.   Initial diagnostic coronary angiogram, performed the following day by Dr.  Riley Kill (see report for full details), revealed high grade proximal in-stent  re-stenosis of the SVG-OM graft as well as in-stent re-stenosis of the  proximal circumflex; a Cypher stent in the mid LAD was widely patent with  atretic LIMA-LAD graft and the SVG-RS VA graft was patent with 30% stenosis  at the anastomosis site.  The LV gram was normal.   The recommendation was to proceed with percutaneous intervention with brady  therapy, which was scheduled for the following day with Dr. Charlies Constable.   Dr. Juanda Chance proceeded with successful PTCA/brachy therapy treatment of the  99% proximal in-stent lesion of the SVG-OM1  graft as well as stenting  (Cypher) of the 80% CFX-OM lesion.  There were no noted complications and  the patient was cleared for discharge the following day in hemodynamically  stable condition.  Dr. Juanda Chance recommends a continuation of Plavix for a full  six months.  No other medication adjustments were made.   PHYSICAL EXAMINATION:  There was no evident hematoma overlying the left  groin incision site.   MEDICATIONS AT DISCHARGE:  Plavix 75 mg q.d. (times six months).  Aspirin  325 mg q.d.  Zocor 40 mg q.h.s.; Toprol XL 50 mg q.d.; Foltx one tablet  q.d.; fish oil tablets as previously directed; Nitrostat 0.4 mg as directed.   INSTRUCTIONS:  No heavy lifting/driving for two days; low fat, low  cholesterol diet; call the office if there is any swelling/bleeding of the  groin.   The patient is scheduled to follow up with Dr. Willa Rough at the [facility] on Thursday, December 18, at 10:15 a.m.    DISCHARGE DIAGNOSES:  1. Coronary artery disease progression.     A. Status post PTCA/brachytherapy, high graft proximal SVG-OM graft and        stent (Cypher), CFX-OM, both secondary to in-stent re-stenosis.     B. Patent stent (Cypher) LAD with atretic LIMA-LAD graft, patent SVG-RCA        graft.     C. Normal left ventricle.     D. Status post stent, LAD, and stent SVG-OM graft September of 2003.     E. Non-STE MI/three vessel CABG, December 2002.  2. Peripheral vascular disease.     A. 80% left RAS; 70% CIA.  3. Dyslipidemia.  4. Chronic obstructive pulmonary disease/history of tobacco.  5. History of transient ischemic attack.  6. History of gastroesophageal reflux disease.                                               Gene Serpe, P.A. [facility]    GS/MEDQ  D:  03/30/2002  T:  03/30/2002  Job:  [id number]   cc:   [facility]  [address]  [address]  [place], [place] [address]

## 2010-09-11 NOTE — Assessment & Plan Note (Signed)
[facility]                          [facility] NOTE   ZYKIA, WALLA                     MRN:          [id number]  DATE:06/16/2006                            DOB:          01-06-1941    HISTORY OF PRESENT ILLNESS:  The patient is a patient of Dr. Myrtis Ser with  known history of coronary artery disease.  The patient over the last  several weeks has described increased problems with throat pain and a  sensation of burning in the chest.  She saw Dr. Myrtis Ser on June 08, 2006, although her symptoms were atypical, he was concerned about this,  particularly given her prior presentation in 2002.  The patient states  that she had similar symptoms but never experienced substernal chest  pain or tightness.  She states that her symptoms have now been going on  off and on for three weeks.  They are sometimes associated with  burping, but they do not appear to be aggravated by fatty foods.  As a  matter of fact, the patient watches her diet carefully.  She also has  noticed some right upper quadrant pain associated with her belching.  There is some component of exertional burning in the chest.  Dr. Myrtis Ser  gave the patient a trial of Imdur and the Prilosec first, particularly  to the fact that the patient currently has no insurance; however, she  tells me that in the last several days, she has had no improvement.  As  a matter of fact, she had a terrible day yesterday with a burning  sensation in the chest off and on.  She feels that Prilosec has not  helped.   MEDICATIONS:  1. Plavix 75 mg a day.  2. Toprol XL 50 mg a day.  3. Aspirin 81 mg a day.  4. Fish oil.  5. Vytorin 10/40.  6. Norvasc 5 mg a day.  7. Imdur 30 mg a day.  8. Prilosec over the counter.   ALLERGIES:  MACRODANTIN, SULFA.   REVIEW OF SYSTEMS:  As per HPI.  No nausea or vomiting.  No fevers or  chills.  No melena, hematemesis, or hematochezia.  No dysuria or   frequency.   PHYSICAL EXAMINATION:  VITAL SIGNS:  Blood pressure 134/78, heart rate  65.  Weight is 144 pounds.  NECK:  Normal carotid upstrokes.  No carotid bruits.  LUNGS:  Clear breath sounds bilaterally.  HEART:  Regular rate and rhythm.  Normal S1 and S2.  No murmurs, rubs or  gallops.  ABDOMEN:  Soft.  EXTREMITIES:  No clubbing, cyanosis or edema.  NEURO:  Patient is alert and oriented.  Grossly nonfocal.   PROBLEM LIST:  See the problem list dictated by Dr. Myrtis Ser.  It includes:  1. History of coronary artery disease.      a.     Coronary artery bypass graft in 2002.      b.     Brachytherapy in 2003.  Vein graft stenosis.      c.     Stent to the vein graft due to restenosis.  d.     Dilatation of a distal anastomosis of a vein graft, obtuse       marginal.  2. 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  3. Dyslipidemia.  4. Allergy to SULFA.  5. Chronic obstructive pulmonary disease.  6. History of gastroesophageal reflux disease.  7. Smoking history, stopped in 2002.   PLAN:  1. The patient has ongoing symptoms that are atypical, but they were      previously atypical prior to bypass surgery.  She has given it a      reasonable trial with Imdur and Prilosec without relief.  2. I have scheduled the patient for a JV catheterization tomorrow in      our JV cath lab.     Learta Codding, MD,FACC  Electronically Signed    GED/MedQ  DD: 06/16/2006  DT: 06/16/2006  Job #: [id number]   cc:   Luis Abed, MD, [facility]  Dr. Sherryll Burger
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
## 2010-09-11 NOTE — Consult Note (Signed)
[place]. [facility]  Patient:    Linda, [name] Visit Number: [id number] MRN: [id number]          Service Type: MED Location: 2000 2025 01 Attending Physician:  Mikey Bussing Dictated by:   Madolyn Frieze Jens Som, M.D. [facility] Proc. Date: 04/10/01 Admit Date:  03/31/2001                            Consultation Report  HISTORY OF PRESENT ILLNESS:  The patient is a 70 year old female who is status post coronary artery bypass and graft who we are consulted for for new onset of atrial flutter.  The patient is originally from [place] and was admitted to [facility] on March 31, 2001 with complaints of chest pain.  She was subsequently transferred to [facility] and underwent cardiac catheterization on April 03, 2001.  This was performed by Dr. Antoine Poche and showed severe three-vessel coronary disease with an ejection fraction of 65%. Of note at that time she was also found to have a 95% left renal artery stenosis as well as a 70% right iliac.  She was also found to have significant COPD.  The patients FEV-1 was noted to be 1.4 L.  She did undergo coronary artery bypass and graft on April 06, 2001 with a LIMA to the LAD, saphenous vein graft to the right coronary artery, and a saphenous vein graft to the left circumflex.  She has done well postoperatively.  However, on April 09, 2001, the patient was noted to develop atrial flutter with rapid ventricular response at approximately 7:30 p.m.  She did complain of mild presyncope with this episode but no frank syncope.  There was also mild shortness of breath but no increased chest pain.  She was treated with digoxin as well as IV amiodarone in addition to her baseline Lopressor.  Her heart rate has now decreased into the 90s.  She is asymptomatic at this point with no increased orthopnea or PND and her pedal edema is unchanged postoperatively.  We were asked to further consult.  PAST MEDICAL  HISTORY:  Coronary artery disease as outlined.  She has a history of COPD.  She has a history of a TIA on the right side approximately 10 years ago.  ALLERGIES:  She is allergic to [facility] and SULFA.  SOCIAL HISTORY:  She does smoke and occasionally consumes alcohol.  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  FAMILY HISTORY:  Her family history is strongly positive for coronary artery disease.  REVIEW OF SYSTEMS:  She denies any recent headaches or fevers or chills. There is no productive cough or hemoptysis.  There is no dysphagia, odynophagia, melena, or hematochezia.  There is no dysuria or hematuria. There is no orthopnea or PND but there is some pedal edema since her surgery. There is some chest soreness following her surgery as well.  The remaining systems are negative.  PHYSICAL EXAMINATION:  VITAL SIGNS:  She is afebrile today and her blood pressure is 104/60.  Her pulse is now 95.  She is 93% on 4 L.  GENERAL:  She is well-developed, well-nourished, in no acute distress.  SKIN:  Warm and dry.  Her sternotomy is without evidence of infection.  There is mild ecchymoses in the epigastric area.  Her harvest sites in the lower extremities are also without evidence of infection.  HEENT:  Her HEENT is unremarkable with no abnormalities.  NECK:  Her neck is supple with a normal upstroke bilaterally and there are no bruits noted.  There is no jugular venous distention and no thyromegaly noted.  CHEST:  Her chest shows diminished breath sounds bilaterally, left greater than right.  There is also crackles at the bases.  CARDIOVASCULAR:  Her cardiovascular exam reveals an irregular rhythm.  I can appreciate no murmurs, rubs or gallops.  ABDOMEN:  Nontender, nondistended, positive bowel sounds; no hepatosplenomegaly and no masses appreciated.  She has 2+ femoral pulses bilaterally.  I can appreciate no bruits.  EXTREMITIES:  Her extremities show 2+ edema to the knees bilaterally.   Her harvest sites are as described above.  I can palpate no cords.  She has diminished dorsalis pedis pulses bilaterally.  NEUROLOGICAL:  Grossly intact.  TELEMETRY:  Review of her telemetry shows atrial flutter with a rapid ventricular response.  DIAGNOSES: 1. Postoperative atrial flutter. 2. Coronary artery disease status post coronary artery bypass and graft. 3. A 95% left renal artery stenosis. 4. A 70% right iliac stenosis. 5. Chronic obstructive pulmonary disease. 6. History of hyperlipidemia. 7. History of hypertension. 8. History of tobacco use.  PLAN:  The patient has developed postoperative atrial flutter.  I agree with rate control with her present medical regimen including low-dose Lopressor, IV amiodarone and digoxin.  If she does not convert in the next 48 hours then I would proceed electively.  Given her COPD, I think she will be at risk for recurrent atrial fibrillation postoperatively.  I therefore agree with continuing her amiodarone at the time of discharge.  However, we should probably discontinue this medication in approximately six weeks if she remains in sinus rhythm at that time.  I would resume her Zocor at the time of discharge and follow up cholesterol panel and liver functions in approximately six weeks.  I would avoid ACE inhibition given her renal artery stenosis.  She will need to follow up for that issue with Dr. Chales Abrahams as she will most likely need percutaneous intervention in the future.  Finally, if she develops recurrent atrial arrhythmias or remains in atrial flutter at the time of discharge then she will need to be anticoagulated short term. Dictated by:   Madolyn Frieze. Jens Som, M.D. [facility] Attending Physician:  Mikey Bussing DD:  04/10/01 TD:  04/10/01 Job: [id number]

## 2010-09-11 NOTE — Cardiovascular Report (Signed)
Linda [name], Linda [name]                            ACCOUNT NO.:  [id number]   MEDICAL RECORD NO.:  [id number]                   PATIENT TYPE:  INP   LOCATION:  4733                                 FACILITY:  [facility]   PHYSICIAN:  Arturo Morton. Riley Kill, M.D. [facility]         DATE OF BIRTH:  04/24/1941   DATE OF PROCEDURE:  03/28/2002  DATE OF DISCHARGE:                              CARDIAC CATHETERIZATION   INDICATIONS:  The patient is a 70 year old female, well known to [place].  She  underwent revascularization surgery in December with an internal mammary to  the LAD, saphenous vein graft to the OM and saphenous vein graft to the  distal right coronary.  She had recurrent symptoms and subsequently was  found to have a high-grade ostial narrowing of the vein graft to the  circumflex and atresia to the LAD graft.  Because of the LAD lesion was  moderate, it was felt likely that this may have contributed to the atresia  and Dr. Chales Abrahams placed a drug-eluting stent in the LAD.  We placed a non drug-  eluting stent in the saphenous vein graft to the OM because of recent  warning placed by the FDA and [employer].  The drug-eluting stents are currently  not approved for this use.  An Express stent was placed in the vein graft to  the OM.  Dr. Chales Abrahams also placed an Express in the native circumflex with the  hope that one of these sources would stay open. She has done well, but  recently developed some recurrent symptoms and now is brought back to the  lab for further evaluation.   PROCEDURES:  1. Left heart catheterization.  2. Selective coronary arteriography.  3. Saphenous vein graft angiography x2.  4. Selective left internal mammary angiography x1.  5. Selective left ventriculography.   DESCRIPTION OF PROCEDURE:  The procedure was performed from the right  femoral artery using 6 French catheters.  He tolerated the procedure well  and there were no complications.   HEMODYNAMIC DATA:  1. Central aorta  163/79.  2. Left ventricular 153/22.  3. No gradient on pullback across the aortic valve.   ANGIOGRAPHIC DATA:  1. The left main coronary artery has about 40% narrowing in the distal most     aspect.  This is unchanged from the previous study.  2. The left anterior descending artery courses to the apex.  In the     midportion is a Cypher drug-eluting stent which is widely patent without     any evidence of any re-stenosis.  There is mild luminal irregularity     distally.  There is a first diagonal branch with about 50% eccentric     stenosis.  3. The circumflex proper demonstrates about 50% narrowing at the ostium and     then the distal portion of the Express stent has high-grade in-stent re-     stenosis.  The vessel distal to this fairly diffusely diseased and there     is evidence of competitive filling of the distal vessel from the graft.  4. The saphenous vein graft to the distal marginal has a stent that covers     the ostium and has mid stenosis of 99% in-stent re-stenosis.  Otherwise,     the graft is intact.  5. The right coronary artery is totally occluded proximally.  6. The vein graft to the distal right is intact.  There is some mild     irregularity at the ostium of the vein graft and also about 30% narrowing     at the insertion to the distal right coronary.  7. The ventriculogram demonstrates normal systolic function.  8. The subclavian demonstrates an atretic internal mammary vessel due to     good distal flow from the native artery.   CONCLUSION:  1. Preserved left ventricular function.  2. Continued patency of the left anterior descending artery following     placement of a drug-eluting stent in September of 2003.  3. High-grade re-stenosis of the stent to the native circumflex with diffuse     disease in the mid vessel.  4. High-grade stenosis in the saphenous vein graft to the distal obtuse     marginal.  5. Total occlusion in the right coronary artery with  patent graft to the     distal right coronary circulation.   DISPOSITION:  Based on the above findings, I would likely recommend repeat  dilatation of the vein graft to the OM.  She will need balloon dilatation of  this, and brachytherapy might well be worth while.  This would reduce the  chance of in-stent re-stenosis. She is not a candidate for the ISR trial  because this represents the vein graft.                                                      Arturo Morton. Riley Kill, M.D. [facility]    TDS/MEDQ  D:  03/28/2002  T:  03/28/2002  Job:  [id number]   cc:   Elby Beck, M.D.   Willa Rough, M.D. [facility]   CV Laboratory

## 2010-09-11 NOTE — Cardiovascular Report (Signed)
[place]  Patient:    Linda [name], Linda [name] Visit Number: [id number] MRN: [id number]          Service Type: MED Location: 2000 2017 01 Attending Physician:  Colon Branch Dictated by:   Rollene Rotunda, M.D. [facility] Proc. Date: 04/03/01 Admit Date:  03/31/2001   CC:         Fayrene Fearing A. Pati Gallo, M.D.  [facility] in [facility]   Cardiac Catheterization  DATE OF BIRTH:  05/24/1940  PROCEDURE:  Left heart catheterization, coronary arteriography.  INDICATIONS FOR PROCEDURE:  The patient with unstable angina and a small troponin elevation (non-Q-wave myocardial infarction).  DESCRIPTION OF PROCEDURE:  Left heart catheterization was performed via the right femoral artery.  The artery is cannulated using anterior wall puncture. A #6 French arterial sheath was inserted via modified Seldinger technique. Preformed Judkins catheters were utilized to cannulate the left main and right coronary artery, pigtail catheter was used to enter the left ventricle for the ventriculogram.  The patient tolerated the procedure well, and left the laboratory in stable condition.  RESULTS: 1. Hemodynamics:  LV 145/14, AL 143/46. 2. Coronaries:  The left main was normal.  The left anterior descending artery    had mid hazy 70% stenosis.  The circumflex was non-dominant.  There was    ostial 60% stenosis and mid 95% stenosis before a large marginal.  The    right coronary artery was a dominant vessel.  There was a long proximal 90%    stenosis with calcification. 3. Left ventriculogram:  Left ventriculogram was obtained in the RAO    projection.  The ejection fraction was 65% with well preserved wall motion. 4. Aortogram:  Distal aortogram was obtained secondary to decreased pulses and    difficulty advancing the catheters past the iliac or aortic bifurcation.    There was noted to be a 95% left renal artery stenosis.  There were luminal    irregularities in the right renal  artery.  There was long 70% stenosis in    the proximal iliac on the right side.  CONCLUSIONS:  Severe three vessel coronary artery disease.  Severe peripheral vascular disease.  Well preserved left ventricular function.  PLAN:  The patient will be referred for coronary artery bypass graft. Eventually, she will need to have consideration for percutaneous revascularization of her left coronary artery, and possibly her right iliac. She will need aggressive secondary risk factor modification.  We have already discussed the need to stop smoking. Dictated by:   Rollene Rotunda, M.D. [facility] Attending Physician:  Colon Branch DD:  04/03/01 TD:  04/03/01 Job: [id number]

## 2010-09-11 NOTE — Discharge Summary (Signed)
NAME:  Linda [name], Linda [name] NO.:  [id number]   MEDICAL RECORD NO.:  [id number]          PATIENT TYPE:  INP   LOCATION:  4711                         FACILITY:  [facility]   PHYSICIAN:  Vida Roller, M.D.   DATE OF BIRTH:  [birth year]   DATE OF ADMISSION:  09/26/2004  DATE OF DISCHARGE:  09/27/2004                           DISCHARGE SUMMARY - REFERRING   SUMMARY OF HISTORY:  Ms. Gerhart is a 70 year old white female who presented  with 2-day history of 6 or 7 fleeting episodes of left arm discomfort which  she describes as tingling without any other associated symptoms.  It was not  reproducible on exam with Dr. Gala Romney.  Given her past medical history,  she was admitted for observation.   PAST MEDICAL HISTORY:  1.  Her past medical history is notable for bypass surgery in December '03      with a LIMA to the LAD, saphenous vein graft to the OM, saphenous vein      graft to the RCA with an underlying three-vessel coronary artery      disease.  Most recent cath, however, showed progressive native coronary      artery disease as well as progressive graft disease; please see last      catheterization report.  Since her bypass, she has undergone several      interventions on her vein grafts.  Her last adenosine Cardiolite was in      February '06 and was reportedly negative.  2.  Her history is also notable for a TIA in March '03.  3.  Hypertension.  4.  Hyperlipidemia.  5.  GERD.  6.  Peripheral vascular disease.   LABORATORY DATA:  Chest x-ray on admission showed mild bibasilar  atelectasis.   EKG shows normal sinus rhythm, normal axis, delayed R wave, nonspecific ST-T  wave changes.   Admission weight was 144.3.   Admission hemoglobin and hematocrit were 12.9 and 37.0, normal indices,  platelets 230,000, WBC 5.4.  Sodium 141, potassium 3.6, BUN 17, creatinine  1.0.  Normal LFTs.  Hemoglobin A1c 5.8.  CK-MBs and troponin x2 were  negative for myocardial  infarction.  Fasting lipids showed a total  cholesterol of 137, triglycerides 203, HDL 37, LDL of 59.   HOSPITAL COURSE:  Ms. Herringshaw was admitted to 39.  Overnight, she did not  have any further arm discomfort.  Enzymes and EKGs were negative for  myocardial infarction.  Dr. Dorethea Clan agreed with Dr. Prescott Gum note.  He  felt that her arm discomfort was very atypical.  He felt that she was low  risk, thus he felt that she could be discharged home with an outpatient  adenosine Myoview in [place] and a followup with Dr. Myrtis Ser in [place].   DISCHARGE DIAGNOSES:  1.  Arm discomfort of uncertain etiology.  2.  Hyperlipidemia.  3.  History as previously.   DISPOSITION:  She is continued on her home medications; these include:  1.  Vytorin 10/40 mg nightly.  2.  Aspirin 325 mg daily.  3.  Toprol-XL 50 mg daily.  4.  Plavix 75 mg daily.  5.  Imdur 30 mg daily.  6.  Nitroglycerin 0.4 mg p.r.n.   DIET:  She was advised to maintain low-salt/-fat/-cholesterol diet.   FOLLOWUP:  I have faxed information to the [facility] office for them to  arrange an outpatient adenosine Myoview in the [place] office.  We have  asked the patient to call the [facility] office to arrange a followup appointment  post adenosine Myoview for Dr. Myrtis Ser.  At the time of followup with Dr. Myrtis Ser,  he will review the adenosine Myoview.  He will also review her lipids that  were obtained in the hospital to consider a more aggressive appropriate for  her high triglycerides and low HDL, given her history.      EW/MEDQ  D:  09/27/2004  T:  09/28/2004  Job:  [id number]   cc:   Wende Crease MD   Willa Rough, M.D.

## 2010-09-11 NOTE — Assessment & Plan Note (Signed)
[facility]                            [facility] OFFICE NOTE   Linda, [name]                     MRN:          [id number]  DATE:02/14/2006                            DOB:          22-Dec-1940    REASON FOR OFFICE VISIT:  Scheduled 18-month followup.   Since last seen here in the clinic in March of this year, by Dr. Myrtis Ser, the  patient has been doing well from a cardiovascular standpoint with no interim  development of angina pectoris or exertional dyspnea.  Her main problem  appears to be blood pressure control which has stabilized extremely well  since this past spring.   She does report recent development of some mild pedal edema, which responds  to conservative management with elevation of her leg whenever possible.  She  denies any associated PND or orthopnea.   Recent blood work notable for a fasting lipid profile revealing a total  cholesterol of 132, triglycerides 170, HDL 32, and LDL 66 (previously 77).   CURRENT MEDICATIONS:  1. Plavix.  2. Aspirin 81 daily.  3. Toprol XL 50 daily.  4. Fish oil.  5. Vytorin 10/40 daily.  6. Norvasc 2.5 daily.   PHYSICAL EXAMINATION:  VITAL SIGNS:  Blood pressure 148/88.  Pulse 64 and  regular.  Weight 146.8.  GENERAL:  A 70 year old female sitting upright in no apparent distress.  NECK:  Palpable bilateral carotid pulses without bruits; no JVD.  LUNGS:  Clear to auscultation all fields.  HEART:  Regular rate and rhythm (S1 and S2).  No significant murmurs.  EXTREMITIES:  Minimally palpable distal pulses with trace pre-tibial edema.  NEURO:  No focal deficits.   IMPRESSION:  1. Coronary artery disease.      a.     Three-vessel coronary artery bypass grafting December 2003:       Left internal mammary artery - left anterior descending; saphenous       vein graft - obtuse marginal; saphenous vein graft - right coronary       artery.      b.     Status post percutaneous coronary  intervention, native       circumflex coronary artery, left anterior descending and saphenous       vein graft - obtuse marginal; cutting balloon percutaneous coronary       transluminal angioplasty/brachytherapy, 99% in-stent restenosis,       saphenous vein graft - obtuse marginal and drug-eluting stent       secondary to in-stent restenosis of native circumflex coronary artery       December 2003.      c.     Non-ischemic adenosine Cardiolite; ejection fraction 68% June       2006.  2. Peripheral vascular disease.      a.     History of 80% right renal artery stenosis.      b.     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  3. Mixed dyslipidemia.      a.     History of low HDL which has since improved by  most recent       profile.  4. Hypertension.      a.     Currently stabilized.  5. History of tobacco.  6. Remote transient ischemic attack.  7. Mild pedal edema.   PLAN:  1. Check a baseline metabolic profile for reassessment of renal function,      in light of the patient's known renal artery stenosis.  2. Continue current medication regimen.  I did suggest, however, the      possibility of adding a low-dose diuretic (e.g. HCTZ) in the event that      her recent pedal edema worsens.  At this time, however, the patient is      reluctant to add any new medications to her regimen. Therefore, we will      continue conservative management of her edema.  3. Return clinic follow up with Dr. Myrtis Ser in 6 months.  4. Repeat a fasting lipid/liver profile in 1 year.      ______________________________  Rozell Searing, PA-C    ______________________________  Luis Abed, MD, [facility]    GS/MedQ  DD:  02/14/2006  DT:  02/15/2006  Job #:  [id number]   cc:   Doreen Beam

## 2010-09-11 NOTE — Cardiovascular Report (Signed)
NAME:  TOPACIO, CELLA NO.:  [id number]   MEDICAL RECORD NO.:  [id number]          PATIENT TYPE:  OIB   LOCATION:  1966                         FACILITY:  [facility]   PHYSICIAN:  Arturo Morton. Riley Kill, MD, FACCDATE OF BIRTH:  07-30-1940   DATE OF PROCEDURE:  06/17/2006  DATE OF DISCHARGE:                            CARDIAC CATHETERIZATION   INDICATIONS:  Ms. Pehrson is a delightful 70 year old who has previously  undergone revascularization surgery.  The current study was done to  assess coronary anatomy.  She has had some recurrent burning in the  epigastrium, but this has also been accompanied by belching which is  new.  She was seen in [place] and subsequently set up for a repeat cardiac  cath.  She has had multiple procedures in the past including  revascularization surgery.  We dilated the saphenous vein graft to the  OM and subsequent to that she has had radiation therapy of the ostial  stent and placement of a CYPHER stent distally.  She has also had  placement of a stent in the left anterior descending and circumflex  coronary arteries.  The current study is done to assess coronary  anatomy.   PROCEDURES:  1. Left heart catheterization  2. Selective coronary arteriography.  3. Selective left ventriculography.  4. Saphenous vein graft angiography.  5. Selective left internal mammary angiography.   DESCRIPTION OF PROCEDURE:  The patient was brought to the  catheterization laboratory, and prepped and draped in usual fashion.  Using a Smart needle, the left femoral artery was entered through an  anterior stick.  A 4-French sheath was then placed.  Following this,  views of the left coronary artery were obtained.  Multiple views were  obtained that best assess the left main artery.  Following this, views  of the right coronary artery, saphenous vein grafts, and internal  mammary were obtained.  Ventriculography was then performed in the RAO  projection.  She  tolerated procedure without complication and hemostasis  was achieved by direct manual compression.   HEMODYNAMIC DATA:  1. Central aortic pressure 151/73, mean 102.  2. Left ventricular pressure 153/6.  3. No gradient on pullback across the aortic valve.   ANGIOGRAPHIC DATA:  1. The left main coronary artery tapers distally.  There is about a      50% area of focal stenosis of the distal aspect of the left main      and its trifurcation.  The left anterior descending artery then      opens up and provides a diagonal which itself has about 50% mid-      narrowing and some ostial stenosis.  The stented portion of the LAD      has about 30-40% narrowing in the mid stent.  The distal LAD wraps      the apex without critical narrowing.  The circumflex proper has      ostial disease of about 50% which involves the left main      trifurcation.  More distally, this goes into a stent which itself  is occluded.  2. The right coronary artery is totally occluded proximally.  3. The saphenous vein graft to the obtuse marginal branch is intact.      The ostium which has been previously radiated has about 30-40%      narrowing but the stent then opens up.  There is a distally placed      drug-eluting stent which was widely patent at the insertion into      the OM.  The saphenous vein graft to the distal right coronary      artery also is intact.  There is some mild irregularity proximally      and then this is followed by about 50% narrowing just beyond the      insertion.  This has been previously noted.  It does not appear to      be critical.  This then fills the right coronary artery retrograde      and in an antegrade fashion.  4. 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   VENTRICULOGRAPHY:  In the RAO projection reveals preserved global  systolic function.   CONCLUSION:  1. Normal left ventricular  function.  2. 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  3. A 50-60% focal narrowing of the left main artery with perhaps      slight progression from the previous study and continued patency of      the stent in the mid left anterior descending artery.  4. Diffuse disease of this previously placed circumflex stent with      slow filling distally.  5. Patent saphenous vein graft to the distal right coronary artery      with the insertion disease as noted previously.   RECOMMENDATIONS:  1. We will arrange follow-up with Dr. Myrtis Ser.  2. I would favor myocardial perfusion imaging to assess LAD flow.  If      the LAD demonstrates significant perfusion deficits, then some      consideration should be given to intravascular ultrasound and      stenting of the left main if there were to be significant ischemia.      The lesion does not appear to be tight or critical, but could be      hemodynamically significant under stress.  I will discuss the case      with Dr. Myrtis Ser.      Arturo Morton. Riley Kill, MD, [facility]  Electronically Signed     TDS/MEDQ  D:  06/17/2006  T:  06/17/2006  Job:  [id number]   cc:   Luis Abed, MD, [facility]
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
## 2010-10-05 ENCOUNTER — Encounter: Payer: Self-pay | Admitting: Cardiology

## 2010-10-09 ENCOUNTER — Encounter: Payer: Self-pay | Admitting: Cardiology

## 2010-10-09 DIAGNOSIS — I2510 Atherosclerotic heart disease of native coronary artery without angina pectoris: Secondary | ICD-10-CM | POA: Insufficient documentation

## 2010-10-09 DIAGNOSIS — R001 Bradycardia, unspecified: Secondary | ICD-10-CM | POA: Insufficient documentation

## 2010-10-09 DIAGNOSIS — I10 Essential (primary) hypertension: Secondary | ICD-10-CM | POA: Insufficient documentation

## 2010-10-09 DIAGNOSIS — T148XXA Other injury of unspecified body region, initial encounter: Secondary | ICD-10-CM | POA: Insufficient documentation

## 2010-10-09 DIAGNOSIS — J449 Chronic obstructive pulmonary disease, unspecified: Secondary | ICD-10-CM | POA: Insufficient documentation

## 2010-10-09 DIAGNOSIS — Z951 Presence of aortocoronary bypass graft: Secondary | ICD-10-CM | POA: Insufficient documentation

## 2010-10-09 DIAGNOSIS — E782 Mixed hyperlipidemia: Secondary | ICD-10-CM | POA: Insufficient documentation

## 2010-10-09 DIAGNOSIS — G459 Transient cerebral ischemic attack, unspecified: Secondary | ICD-10-CM | POA: Insufficient documentation

## 2010-10-09 DIAGNOSIS — R9430 Abnormal result of cardiovascular function study, unspecified: Secondary | ICD-10-CM | POA: Insufficient documentation

## 2010-10-12 ENCOUNTER — Encounter: Payer: Self-pay | Admitting: Cardiology

## 2010-10-12 ENCOUNTER — Ambulatory Visit (INDEPENDENT_AMBULATORY_CARE_PROVIDER_SITE_OTHER): Payer: Medicare Other | Admitting: Cardiology

## 2010-10-12 DIAGNOSIS — I2510 Atherosclerotic heart disease of native coronary artery without angina pectoris: Secondary | ICD-10-CM

## 2010-10-12 DIAGNOSIS — I10 Essential (primary) hypertension: Secondary | ICD-10-CM

## 2010-10-12 DIAGNOSIS — T148XXA Other injury of unspecified body region, initial encounter: Secondary | ICD-10-CM

## 2010-10-12 DIAGNOSIS — R000 Tachycardia, unspecified: Secondary | ICD-10-CM | POA: Insufficient documentation

## 2010-10-12 DIAGNOSIS — Z79899 Other long term (current) drug therapy: Secondary | ICD-10-CM

## 2010-10-12 NOTE — Assessment & Plan Note (Signed)
Blood pressure is controlled. No change in therapy. 

## 2010-10-12 NOTE — Patient Instructions (Signed)
Your physician wants you to follow-up in: 6 months. You will receive a reminder letter in the mail one-two months in advance. If you don't receive a letter, please call our office to schedule the follow-up appointment. Your physician recommends that you continue on your current medications as directed. Please refer to the Current Medication list given to you today. Your physician recommends that you go to the [facility] for lab work: CBC/BMET/TSH. If the results of your test are normal or stable, you will receive a letter. If they are abnormal, the nurse will contact you by phone. Record heart rates and call these to [facility]. (Press option 4 and then option 2.)

## 2010-10-12 NOTE — Assessment & Plan Note (Signed)
Fortunately this issue is resolved.  She has some residual skin changes in the left medial thigh.  There is no palpable hematoma.

## 2010-10-12 NOTE — Assessment & Plan Note (Signed)
The patient has noted recently that her heart rate seems to be running in the range of 90.  Historically she has been bradycardic.  Her beta blocker dose has not changed.  There is no obvious etiology.  Today we will check CBC and chemistry and thyroid functions to be sure there has not been any other change.  Otherwise she will check her pulse at home with her blood pressure cuff from time to time and let us know the data

## 2010-10-12 NOTE — Progress Notes (Signed)
HPI Patient is seen today to followup coronary artery disease.  I saw her last May 26, 2010.  At that time she had some difficulties and actually underwent cardiac catheterization.  Her anatomy was stable.  The area in her left main was stable.  He unfortunately had a significant hematoma and was readmitted briefly for this.  There was no pseudoaneurysm or fistula.  This stabilized and she is now doing well.  He's not had any significant symptoms.  It is of note that her heart rate has been mildly increased.  In the past she's had some bradycardia.  She is not having symptoms from this heart rate.  Today it is running approximately 95 the office.  As part of today's evaluation I have reviewed the hospital admission and discharge from 2 hospitalizations.  I reviewed the catheterization report dated this electronic medical record Allergies  Allergen Reactions  . Sulfonamide Derivatives     REACTION: Rash    Current Outpatient Prescriptions  Medication Sig Dispense Refill  . ALPRAZolam (XANAX) 0.25 MG tablet Take 0.25 mg by mouth at bedtime as needed.        Marland Kitchen amLODipine (NORVASC) 5 MG tablet Take 5 mg by mouth daily.        Marland Kitchen aspirin 81 MG tablet Take 81 mg by mouth daily.        Marland Kitchen atorvastatin (LIPITOR) 20 MG tablet Take 20 mg by mouth daily.        . Cholecalciferol (VITAMIN D) 1000 UNITS capsule Take 1,000 Units by mouth daily.        . clopidogrel (PLAVIX) 75 MG tablet Take 75 mg by mouth daily.       Marland Kitchen imipramine (TOFRANIL) 25 MG tablet Take 25 mg by mouth daily.        . isosorbide mononitrate (IMDUR) 60 MG 24 hr tablet Take 60 mg by mouth daily.       . metoprolol (TOPROL-XL) 50 MG 24 hr tablet Take 75 mg by mouth daily.        . nitroGLYCERIN (NITROSTAT) 0.4 MG SL tablet as directed.        . Omega-3 Fatty Acids (FISH OIL) 1000 MG CAPS Take 2 capsules by mouth 2 (two) times daily.        Marland Kitchen DISCONTD: atorvastatin (LIPITOR) 40 MG tablet Take 40 mg by mouth daily.        Marland Kitchen DISCONTD:  Cholecalciferol (VITAMIN D) 2000 UNITS tablet Take 2,000 Units by mouth daily.        Marland Kitchen DISCONTD: gabapentin (NEURONTIN) 100 MG capsule Take 100 mg by mouth at bedtime.          History   Social History  . Marital Status: Married    Spouse Name: N/A    Number of Children: N/A  . Years of Education: N/A   Occupational History  . Retired    Social History Main Topics  . Smoking status: Former Smoker -- 1.0 packs/day for 35 years    Types: Cigarettes    Quit date: 04/26/2000  . Smokeless tobacco: Never Used  . Alcohol Use: No  . Drug Use: Not on file  . Sexually Active: Not on file   Other Topics Concern  . Not on file   Social History Narrative  . No narrative on file    Family History  Problem Relation Age of Onset  . Coronary artery disease      Past Medical History  Diagnosis Date  . Hypertension  Unspecified   . Hyperlipidemia, mixed   . S/P CABG (coronary artery bypass graft)   . CAD (coronary artery disease)     Cath 2008, [place] atretic, 60% Left main  /   cath  06/02/2010..no change, 50% distal L Main, patent SVGs to OM and RCA  . Bradycardia     sinus, assymptomatic  . PAD (peripheral artery disease)     right renal artery stenosis and right common  iliac stenosis  . COPD (chronic obstructive pulmonary disease)   . TIA (transient ischemic attack)     remote  . Hematoma     Readmitted 06/03/2010 post cath for L groin hematoma. ,, stabilized with no compression...home  . Ejection fraction     65%, cath, 05/2010  . Tachycardia     June, 2012    No past surgical history on file.  ROS  Patient denies fever, chills, headache, sweats, rash, change in vision, change in hearing, chest pain, cough, nausea vomiting, urinary symptoms.  All other systems are reviewed and are negative.  PHYSICAL EXAM Patient looks quite good today.  She is oriented to person time and place.  Affect is normal.  Head is atraumatic.  There is no xanthelasma.  There is no  jugulovenous distention.  Lungs are clear.  Respiratory effort is nonlabored.  Cardiac exam reveals S1 and S2.  No clicks or significant murmurs.  The abdomen is soft.  There is no significant peripheral edema.  No musculoskeletal deformities.  There are no skin rashes.  She does have mild skin change in the left medial thigh which is residual from her hematoma. Filed Vitals:   10/12/10 0825  BP: 133/82  Pulse: 96  Height: 5' 1.5" (1.562 m)  Weight: 140 lb (63.504 kg)    EKG Is not done today  ASSESSMENT & PLAN

## 2010-10-12 NOTE — Assessment & Plan Note (Signed)
Coronary disease is stable.  Catheterization in February 2 012 revealed no change.  She is 50% distal left main.  Her vein grafts are patent.  The LIMA is atretic.  This is unchanged from the past.  No further workup at this time.

## 2010-10-14 ENCOUNTER — Telehealth: Payer: Self-pay | Admitting: *Deleted

## 2010-10-14 NOTE — Telephone Encounter (Signed)
Message copied by Arlyss Gandy on Wed Oct 14, 2010  4:13 PM ------      Message from: Myrtis Ser, [place] D      Created: Wed Oct 14, 2010  1:07 PM       Labs are good

## 2010-10-14 NOTE — Telephone Encounter (Signed)
Pt notified of lab results and verbalized understanding. She states her HR was 78 today.

## 2010-10-27 ENCOUNTER — Telehealth: Payer: Self-pay | Admitting: *Deleted

## 2010-10-29 NOTE — Telephone Encounter (Signed)
Sounds good. No change for now.

## 2010-11-02 NOTE — Telephone Encounter (Signed)
Patient informed via message machine. 

## 2010-11-24 ENCOUNTER — Telehealth: Payer: Self-pay | Admitting: *Deleted

## 2010-11-24 NOTE — Telephone Encounter (Signed)
Pt left message on voicemail asking for a return call. She states in message that she was told by Dr. Myrtis Ser to keep a check on BP/HR for 2 weeks. She states she was doing well but has been having some problems. She saw Dr. Sherryll Burger yesterday and wanted to let us know what he was planning to do.   Contacted pt who states when she called w/BP/HR around the 4th of July she was doing well. She states last Wednesday she had been busy all day. She states while cooking she got very weak and lightheaded. She states she sat down and after passed she checked HR. She states HR was 117. She states she kept a check on it throughout the day and it went down. She states it was good on Thursday. She states Friday while doing some dusting she didn't feel quite right so she checked HR. HR was 103. She states she Dr. Sherryll Burger yesterday. She states Dr. Sherryll Burger ordered a Cardionet monitor that will be delivered next week. Dr. Sherryll Burger told pt, if HR went up and wouldn't go down, to go to the ER.   She is aware to have Dr. Sherryll Burger send [place] copies of the strips and final summary when available. Pt scheduled with Dr. Myrtis Ser for follow up on Sept 14th.

## 2011-01-05 ENCOUNTER — Encounter: Payer: Self-pay | Admitting: Cardiology

## 2011-01-08 ENCOUNTER — Encounter: Payer: Self-pay | Admitting: Cardiology

## 2011-01-08 ENCOUNTER — Ambulatory Visit (INDEPENDENT_AMBULATORY_CARE_PROVIDER_SITE_OTHER): Payer: Medicare Other | Admitting: Cardiology

## 2011-01-08 DIAGNOSIS — I10 Essential (primary) hypertension: Secondary | ICD-10-CM

## 2011-01-08 DIAGNOSIS — I2510 Atherosclerotic heart disease of native coronary artery without angina pectoris: Secondary | ICD-10-CM

## 2011-01-08 DIAGNOSIS — R000 Tachycardia, unspecified: Secondary | ICD-10-CM

## 2011-01-08 NOTE — Assessment & Plan Note (Signed)
It appears that the patient has some mild changes in her resting heart rate.  There is no pathologic bradycardia or tachycardia.  I definitely do not want to slow her heart rate any further with any additional medicines.  We both agree that she is stable and does not need any further workup for this problem.

## 2011-01-08 NOTE — Progress Notes (Signed)
HPI   Patient is seen today to followup coronary disease and tachycardia.  I saw her last on October 12, 2010.  At that time she complained of some increased heart rate.  I encouraged her to call back in with her heart rates.  She did that over the next 2 weeks and her rates range from 75-90.  She then had a episode one day of not feeling well.  There may have been some mild dizziness.  She checked her heart rate and was approximately 110.  She felt better as the day went on.  She saw her primary physician and an event recorder was placed.  She has brought me the total packet of information from the recorder.  She had normal heart rate ranges.  At one point her heart rate was 51 resting.  She had some mild sinus tachycardia.  Overall her heart rate was well-controlled and there was no marked bradycardia or tachycardia.  I have reviewed that report completely. Allergies  Allergen Reactions  . Sulfonamide Derivatives     REACTION: Rash    Current Outpatient Prescriptions  Medication Sig Dispense Refill  . ALPRAZolam (XANAX) 0.25 MG tablet Take 0.25 mg by mouth at bedtime as needed.        Marland Kitchen amLODipine (NORVASC) 5 MG tablet Take 5 mg by mouth daily.        Marland Kitchen aspirin 81 MG tablet Take 81 mg by mouth daily.        Marland Kitchen atorvastatin (LIPITOR) 20 MG tablet Take 20 mg by mouth daily.        . Cholecalciferol (VITAMIN D) 1000 UNITS capsule Take 1,000 Units by mouth daily.        . clopidogrel (PLAVIX) 75 MG tablet Take 75 mg by mouth daily.       Marland Kitchen imipramine (TOFRANIL) 25 MG tablet Take 25 mg by mouth daily.        . isosorbide mononitrate (IMDUR) 60 MG 24 hr tablet Take 60 mg by mouth daily.       . metoprolol (TOPROL-XL) 50 MG 24 hr tablet Take 75 mg by mouth daily.        . nitroGLYCERIN (NITROSTAT) 0.4 MG SL tablet as directed.        . Omega-3 Fatty Acids (FISH OIL) 1000 MG CAPS Take 2 capsules by mouth 2 (two) times daily.          History   Social History  . Marital Status: Married    Spouse  Name: N/A    Number of Children: N/A  . Years of Education: N/A   Occupational History  . Retired    Social History Main Topics  . Smoking status: Former Smoker -- 1.0 packs/day for 35 years    Types: Cigarettes    Quit date: 04/26/2000  . Smokeless tobacco: Never Used  . Alcohol Use: No  . Drug Use: Not on file  . Sexually Active: Not on file   Other Topics Concern  . Not on file   Social History Narrative  . No narrative on file    Family History  Problem Relation Age of Onset  . Coronary artery disease      Past Medical History  Diagnosis Date  . Hypertension     Unspecified   . Hyperlipidemia, mixed   . S/P CABG (coronary artery bypass graft)   . CAD (coronary artery disease)     Cath 2008, [place] atretic, 60% Left main  /  cath  06/02/2010..no change, 50% distal L Main, patent SVGs to OM and RCA  . Bradycardia     sinus, assymptomatic  . PAD (peripheral artery disease)     right renal artery stenosis and right common  iliac stenosis  . COPD (chronic obstructive pulmonary disease)   . TIA (transient ischemic attack)     remote  . Hematoma     Readmitted 06/03/2010 post cath for L groin hematoma. ,, stabilized with no compression...home  . Ejection fraction     65%, cath, 05/2010  . Tachycardia     June, 2012    No past surgical history on file.  ROS  Patient denies fever, chills, headache, sweats, rash, change in vision, change in hearing, chest pain, cough, nausea vomiting, urinary symptoms.  All other systems are reviewed and are negative. PHYSICAL EXAM Physical examination today is limited as I have a URI. Filed Vitals:   01/08/11 0844  BP: 153/82  Pulse: 75  Resp: 16  Height: 5\' 2"  (1.575 m)  Weight: 139 lb (63.05 kg)    EKG EKG is not done today.  As noted I have reviewed her record.  ASSESSMENT & PLAN

## 2011-01-08 NOTE — Assessment & Plan Note (Signed)
Blood pressure is well controlled. No change in therapy. 

## 2011-01-08 NOTE — Patient Instructions (Signed)
Continue all current medications. Your physician wants you to follow up in: 6 months.  You will receive a reminder letter in the mail one-two months in advance.  If you don't receive a letter, please call our office to schedule the follow up appointment   

## 2011-01-08 NOTE — Assessment & Plan Note (Signed)
Coronary disease is stable.  No change in therapy.  I'll see her back in 6 months.

## 2011-07-07 ENCOUNTER — Ambulatory Visit: Payer: Medicare Other | Admitting: Cardiology

## 2011-07-15 ENCOUNTER — Ambulatory Visit: Payer: Medicare Other | Admitting: Cardiology

## 2011-08-13 ENCOUNTER — Ambulatory Visit (INDEPENDENT_AMBULATORY_CARE_PROVIDER_SITE_OTHER): Payer: Medicare Other | Admitting: Cardiology

## 2011-08-13 ENCOUNTER — Encounter: Payer: Self-pay | Admitting: Cardiology

## 2011-08-13 VITALS — BP 136/79 | HR 67 | Ht 62.0 in | Wt 137.0 lb

## 2011-08-13 DIAGNOSIS — R000 Tachycardia, unspecified: Secondary | ICD-10-CM

## 2011-08-13 DIAGNOSIS — I2510 Atherosclerotic heart disease of native coronary artery without angina pectoris: Secondary | ICD-10-CM

## 2011-08-13 DIAGNOSIS — I739 Peripheral vascular disease, unspecified: Secondary | ICD-10-CM | POA: Insufficient documentation

## 2011-08-13 DIAGNOSIS — E782 Mixed hyperlipidemia: Secondary | ICD-10-CM

## 2011-08-13 DIAGNOSIS — I10 Essential (primary) hypertension: Secondary | ICD-10-CM

## 2011-08-13 NOTE — Patient Instructions (Signed)
   Lower extremity arterial dopplers  If the results of your test are normal or stable, you will receive a letter.  If they are abnormal, the nurse will contact you by phone. Your physician wants you to follow up in: 6 months.  You will receive a reminder letter in the mail one-two months in advance.  If you don't receive a letter, please call our office to schedule the follow up appointment

## 2011-08-13 NOTE — Assessment & Plan Note (Signed)
I'm not sure if her current symptoms are claudication or related to nerve root irritation. She has known disease in her peripheral vascular tree. Her symptoms seem to be fairly limiting. She's not having any rest pain. We will proceed with arterial Dopplers for further assessment.

## 2011-08-13 NOTE — Assessment & Plan Note (Signed)
Coronary disease is stable.  She does not need any further workup at this time. 

## 2011-08-13 NOTE — Assessment & Plan Note (Signed)
Blood pressure is controlled. No change in therapy. 

## 2011-08-13 NOTE — Assessment & Plan Note (Signed)
From time to time she has some increased heart rate. We know this is mild sinus tachycardia. She rests and it goes away. No further workup.

## 2011-08-13 NOTE — Progress Notes (Signed)
HPI  Patient's here to followup coronary disease. I saw her last September, 2012. She had some symptoms and underwent repeat catheterization in February, 2012. There was no change. She had patent grafts. There was 50% distal left main. This was stable. The LIMA is atretic. She's not having chest pain or shortness of breath.  She does mention that she has discomfort when walking. This occurs in her buttocks and radiates around the lateral aspect of her leg. This may be claudication. However it could be related to I nerve root irritation. We know that she has vascular disease.  Allergies  Allergen Reactions  . Sulfonamide Derivatives     REACTION: Rash    Current Outpatient Prescriptions  Medication Sig Dispense Refill  . ALPRAZolam (XANAX) 0.25 MG tablet Take 0.25 mg by mouth at bedtime as needed.        Marland Kitchen amLODipine (NORVASC) 5 MG tablet Take 5 mg by mouth daily.        Marland Kitchen aspirin 81 MG tablet Take 81 mg by mouth daily.        Marland Kitchen atorvastatin (LIPITOR) 20 MG tablet Take 20 mg by mouth daily.        . clopidogrel (PLAVIX) 75 MG tablet Take 75 mg by mouth daily.       Marland Kitchen docusate sodium (COLACE) 100 MG capsule Take 100 mg by mouth as needed.      Marland Kitchen imipramine (TOFRANIL) 25 MG tablet Take 25 mg by mouth daily.        . isosorbide mononitrate (IMDUR) 60 MG 24 hr tablet Take 60 mg by mouth daily.       . metoprolol (TOPROL-XL) 50 MG 24 hr tablet Take 75 mg by mouth daily.        . nitroGLYCERIN (NITROSTAT) 0.4 MG SL tablet as directed.        . olmesartan (BENICAR) 20 MG tablet Take 20 mg by mouth daily.      . Omega-3 Fatty Acids (FISH OIL) 1000 MG CAPS Take 2 capsules by mouth 2 (two) times daily.          History   Social History  . Marital Status: Married    Spouse Name: N/A    Number of Children: N/A  . Years of Education: N/A   Occupational History  . Retired    Social History Main Topics  . Smoking status: Former Smoker -- 1.0 packs/day for 35 years    Types: Cigarettes   Quit date: 04/26/2000  . Smokeless tobacco: Never Used  . Alcohol Use: No  . Drug Use: Not on file  . Sexually Active: Not on file   Other Topics Concern  . Not on file   Social History Narrative  . No narrative on file    Family History  Problem Relation Age of Onset  . Coronary artery disease      Past Medical History  Diagnosis Date  . Hypertension     Unspecified   . Hyperlipidemia, mixed   . S/P CABG (coronary artery bypass graft)   . CAD (coronary artery disease)     Cath 2008, [place] atretic, 60% Left main  /   cath  06/02/2010..no change, 50% distal L Main, patent SVGs to OM and RCA  . Bradycardia     sinus, assymptomatic  . PAD (peripheral artery disease)     right renal artery stenosis and right common  iliac stenosis  . COPD (chronic obstructive pulmonary disease)   . TIA (  transient ischemic attack)     remote  . Hematoma     Readmitted 06/03/2010 post cath for L groin hematoma. ,, stabilized with no compression...home  . Ejection fraction     65%, cath, 05/2010  . Tachycardia     June, 2012    No past surgical history on file.  ROS   Patient denies fever, chills, headache, sweats, rash, change in vision, change in hearing, chest pain, cough, nausea vomiting, urinary symptoms. All other systems are reviewed and are negative.  PHYSICAL EXAM  Patient is oriented to person time and place. Affect is normal. There is no jugulovenous distention. Lungs are clear. Respiratory effort is nonlabored. Cardiac exam reveals S1 and S2. There no clicks or significant murmurs. The abdomen is soft. Is no peripheral edema. There are no musculoskeletal deformities. There are no skin rashes.  Filed Vitals:   08/13/11 1344  BP: 136/79  Pulse: 67  Height: 5\' 2"  (1.575 m)  Weight: 137 lb (62.143 kg)   EKG is done today and reviewed by me. She has sinus rhythm. She has old inverted T waves in lead V2. There is no significant change when compared to prior tracing.  ASSESSMENT &  PLAN

## 2011-08-13 NOTE — Assessment & Plan Note (Signed)
She's getting appropriate medications for her lipids. No change in therapy.

## 2011-09-22 ENCOUNTER — Encounter (INDEPENDENT_AMBULATORY_CARE_PROVIDER_SITE_OTHER): Payer: Medicare Other

## 2011-09-22 DIAGNOSIS — I70219 Atherosclerosis of native arteries of extremities with intermittent claudication, unspecified extremity: Secondary | ICD-10-CM

## 2011-09-22 DIAGNOSIS — I739 Peripheral vascular disease, unspecified: Secondary | ICD-10-CM

## 2011-10-13 ENCOUNTER — Ambulatory Visit (INDEPENDENT_AMBULATORY_CARE_PROVIDER_SITE_OTHER): Payer: Medicare Other | Admitting: Cardiovascular Disease

## 2011-10-13 ENCOUNTER — Encounter: Payer: Self-pay | Admitting: Cardiovascular Disease

## 2011-10-13 VITALS — BP 106/66 | HR 68 | Resp 16 | Ht 61.0 in | Wt 139.0 lb

## 2011-10-13 DIAGNOSIS — I2510 Atherosclerotic heart disease of native coronary artery without angina pectoris: Secondary | ICD-10-CM

## 2011-10-13 DIAGNOSIS — I739 Peripheral vascular disease, unspecified: Secondary | ICD-10-CM

## 2011-10-13 DIAGNOSIS — Z01812 Encounter for preprocedural laboratory examination: Secondary | ICD-10-CM

## 2011-10-13 NOTE — Patient Instructions (Addendum)
Your physician has requested that you have a peripheral vascular angiogram. This exam is performed at the hospital. During this exam IV contrast is used to look at arterial blood flow. Please review the information sheet given for details.   

## 2011-10-19 ENCOUNTER — Encounter: Payer: Self-pay | Admitting: Cardiovascular Disease

## 2011-10-19 NOTE — Progress Notes (Signed)
HPI  This is a 71 year old pleasant female who was referred by Dr. Myrtis Ser for evaluation and management of claudication and peripheral arterial disease. The patient has known history of coronary artery disease status post CABG. She underwent cardiac catheterization in February, 2012 via the left femoral artery which showed patent grafts. There was 50% distal left main. This was stable. The LIMA is atretic. She's not having chest pain or shortness of breath.  She describes severe discomfort mostly in the buttock and thigh area in the right side but not much on the left. This happens after walking only a short distance even when she is doing activities of daily living. She is known to have right iliac disease. Most of her cardiac catheterization were performed via the left femoral artery for that reason. Last catheterization in 2012 was complicated by large hematoma which did not require blood transfusion. Cardiac catheterization in 2012 showed a 70% stenosis in the right iliac artery as well as a 95% stenosis in the right renal artery.   Allergies  Allergen Reactions  . Sulfonamide Derivatives     REACTION: Rash     Current Outpatient Prescriptions on File Prior to Visit  Medication Sig Dispense Refill  . ALPRAZolam (XANAX) 0.25 MG tablet Take 0.25 mg by mouth at bedtime as needed.        Marland Kitchen amLODipine (NORVASC) 5 MG tablet Take 5 mg by mouth daily.        Marland Kitchen aspirin 81 MG tablet Take 81 mg by mouth daily.        Marland Kitchen atorvastatin (LIPITOR) 20 MG tablet Take 20 mg by mouth daily.        . clopidogrel (PLAVIX) 75 MG tablet Take 75 mg by mouth daily.       Marland Kitchen imipramine (TOFRANIL) 25 MG tablet Take 25 mg by mouth daily.        . isosorbide mononitrate (IMDUR) 60 MG 24 hr tablet Take 60 mg by mouth daily.       . metoprolol (TOPROL-XL) 50 MG 24 hr tablet Take 75 mg by mouth daily.        . nitroGLYCERIN (NITROSTAT) 0.4 MG SL tablet as directed.        . olmesartan (BENICAR) 20 MG tablet Take 20 mg  by mouth daily.      . Omega-3 Fatty Acids (FISH OIL) 1000 MG CAPS Take 2 capsules by mouth 2 (two) times daily.        Marland Kitchen docusate sodium (COLACE) 100 MG capsule Take 100 mg by mouth as needed.         Past Medical History  Diagnosis Date  . Hypertension     Unspecified   . Hyperlipidemia, mixed   . S/P CABG (coronary artery bypass graft)   . CAD (coronary artery disease)     Cath 2008, [place] atretic, 60% Left main  /   cath  06/02/2010..no change, 50% distal L Main, patent SVGs to OM and RCA  . Bradycardia     sinus, assymptomatic  . PAD (peripheral artery disease)     right renal artery stenosis and right common  iliac stenosis  . COPD (chronic obstructive pulmonary disease)   . TIA (transient ischemic attack)     remote  . Hematoma     Readmitted 06/03/2010 post cath for L groin hematoma. ,, stabilized with no compression...home  . Ejection fraction     65%, cath, 05/2010  . Tachycardia     June,  2012  . Claudication     April, 2013     History reviewed. No pertinent past surgical history.   Family History  Problem Relation Age of Onset  . Coronary artery disease       History   Social History  . Marital Status: Married    Spouse Name: N/A    Number of Children: N/A  . Years of Education: N/A   Occupational History  . Retired    Social History Main Topics  . Smoking status: Former Smoker -- 1.0 packs/day for 35 years    Types: Cigarettes    Quit date: 04/26/2000  . Smokeless tobacco: Never Used  . Alcohol Use: No  . Drug Use: Not on file  . Sexually Active: Not on file   Other Topics Concern  . Not on file   Social History Narrative  . No narrative on file     ROS Constitutional: Negative for fever, chills, diaphoresis, activity change, appetite change and fatigue.  HENT: Negative for hearing loss, nosebleeds, congestion, sore throat, facial swelling, drooling, trouble swallowing, neck pain, voice change, sinus pressure and tinnitus.  Eyes:  Negative for photophobia, pain, discharge and visual disturbance.  Respiratory: Negative for apnea, cough, chest tightness, shortness of breath and wheezing.  Cardiovascular: Negative for chest pain, palpitations and leg swelling.  Gastrointestinal: Negative for nausea, vomiting, abdominal pain, diarrhea, constipation, blood in stool and abdominal distention.  Genitourinary: Negative for dysuria, urgency, frequency, hematuria and decreased urine volume.  Skin: Negative for color change, pallor, rash and wound.  Neurological: Negative for dizziness, tremors, seizures, syncope, speech difficulty, weakness, light-headedness, numbness and headaches.  Psychiatric/Behavioral: Negative for suicidal ideas, hallucinations, behavioral problems and agitation. The patient is not nervous/anxious.     PHYSICAL EXAM   BP 106/66  Pulse 68  Resp 16  Ht 5\' 1"  (1.549 m)  Wt 139 lb (63.05 kg)  BMI 26.26 kg/m2  Constitutional: She is oriented to person, place, and time. She appears well-developed and well-nourished. No distress.  HENT: No nasal discharge.  Head: Normocephalic and atraumatic.  Eyes: Pupils are equal and round. Right eye exhibits no discharge. Left eye exhibits no discharge.  Neck: Normal range of motion. Neck supple. No JVD present. No thyromegaly present.  Cardiovascular: Normal rate, regular rhythm, normal heart sounds. Exam reveals no gallop and no friction rub. No murmur heard.  Pulmonary/Chest: Effort normal and breath sounds normal. No stridor. No respiratory distress. She has no wheezes. She has no rales. She exhibits no tenderness.  Abdominal: Soft. Bowel sounds are normal. She exhibits no distension. There is no tenderness. There is no rebound and no guarding.  Musculoskeletal: Normal range of motion. She exhibits no edema and no tenderness.  Neurological: She is alert and oriented to person, place, and time. Coordination normal.  Skin: Skin is warm and dry. No rash noted. She is  not diaphoretic. No erythema. No pallor.  Psychiatric: She has a normal mood and affect. Her behavior is normal. Judgment and thought content normal.  Vascular: Right femoral pulse: Absent on the right side and +2 left side with a bruit. DP/PT: Absent on the right side and +1 on the left.    ASSESSMENT AND PLAN

## 2011-10-19 NOTE — Assessment & Plan Note (Signed)
The patient has significant lifestyle limiting claudication in the buttock and thigh area right worse than left. Her right iliac artery is likely occluded as I could not feel any femoral pulse in that area. Her symptoms have been progressive over the last year. ABI was moderately reduced on the right side at 0.59 and mildly reduced on the left side at 0.87. She is already on good medical therapy for atherosclerosis. Due to severity of her symptoms, I recommend proceeding with abdominal aortogram, lower extremity runoff and possible angioplasty via the left femoral artery access. Risks, benefits and alternatives were discussed with the patient. The patient had repeated catheterizations in the past through that route with a large hematoma complicating the most recent procedure. If angioplasty is performed, I will likely plan on observing her overnight.

## 2011-10-21 ENCOUNTER — Encounter (HOSPITAL_COMMUNITY): Payer: Self-pay | Admitting: Pharmacy Technician

## 2011-11-01 ENCOUNTER — Other Ambulatory Visit (INDEPENDENT_AMBULATORY_CARE_PROVIDER_SITE_OTHER): Payer: Medicare Other

## 2011-11-01 DIAGNOSIS — Z01812 Encounter for preprocedural laboratory examination: Secondary | ICD-10-CM

## 2011-11-01 DIAGNOSIS — I2510 Atherosclerotic heart disease of native coronary artery without angina pectoris: Secondary | ICD-10-CM

## 2011-11-01 DIAGNOSIS — I739 Peripheral vascular disease, unspecified: Secondary | ICD-10-CM

## 2011-11-01 LAB — BASIC METABOLIC PANEL
CO2: 26 mEq/L (ref 19–32)
Chloride: 105 mEq/L (ref 96–112)
Glucose, Bld: 100 mg/dL — ABNORMAL HIGH (ref 70–99)
Potassium: 4.2 mEq/L (ref 3.5–5.1)
Sodium: 139 mEq/L (ref 135–145)

## 2011-11-01 LAB — CBC WITH DIFFERENTIAL/PLATELET
Eosinophils Relative: 2.1 % (ref 0.0–5.0)
HCT: 40.1 % (ref 36.0–46.0)
Monocytes Relative: 8.9 % (ref 3.0–12.0)
Neutrophils Relative %: 54.4 % (ref 43.0–77.0)
Platelets: 225 10*3/uL (ref 150.0–400.0)
WBC: 6 10*3/uL (ref 4.5–10.5)

## 2011-11-01 LAB — PROTIME-INR: Prothrombin Time: 10.6 s (ref 10.2–12.4)

## 2011-11-02 ENCOUNTER — Other Ambulatory Visit: Payer: Self-pay | Admitting: Cardiovascular Disease

## 2011-11-02 DIAGNOSIS — I739 Peripheral vascular disease, unspecified: Secondary | ICD-10-CM

## 2011-11-03 ENCOUNTER — Ambulatory Visit (HOSPITAL_COMMUNITY)
Admission: RE | Admit: 2011-11-03 | Discharge: 2011-11-03 | Disposition: A | Discharge status: Home / Self Care | Payer: Medicare Other | Source: Ambulatory Visit | Attending: Cardiovascular Disease | Admitting: Cardiovascular Disease

## 2011-11-03 ENCOUNTER — Encounter (HOSPITAL_COMMUNITY)
Admission: RE | Disposition: A | Discharge status: Home / Self Care | Payer: Self-pay | Source: Ambulatory Visit | Attending: Cardiovascular Disease

## 2011-11-03 DIAGNOSIS — I701 Atherosclerosis of renal artery: Secondary | ICD-10-CM | POA: Insufficient documentation

## 2011-11-03 DIAGNOSIS — J449 Chronic obstructive pulmonary disease, unspecified: Secondary | ICD-10-CM | POA: Insufficient documentation

## 2011-11-03 DIAGNOSIS — I739 Peripheral vascular disease, unspecified: Secondary | ICD-10-CM | POA: Insufficient documentation

## 2011-11-03 DIAGNOSIS — J4489 Other specified chronic obstructive pulmonary disease: Secondary | ICD-10-CM | POA: Insufficient documentation

## 2011-11-03 DIAGNOSIS — I2510 Atherosclerotic heart disease of native coronary artery without angina pectoris: Secondary | ICD-10-CM | POA: Insufficient documentation

## 2011-11-03 DIAGNOSIS — I708 Atherosclerosis of other arteries: Secondary | ICD-10-CM | POA: Insufficient documentation

## 2011-11-03 DIAGNOSIS — E782 Mixed hyperlipidemia: Secondary | ICD-10-CM | POA: Insufficient documentation

## 2011-11-03 DIAGNOSIS — Z8673 Personal history of transient ischemic attack (TIA), and cerebral infarction without residual deficits: Secondary | ICD-10-CM | POA: Insufficient documentation

## 2011-11-03 DIAGNOSIS — Z951 Presence of aortocoronary bypass graft: Secondary | ICD-10-CM | POA: Insufficient documentation

## 2011-11-03 DIAGNOSIS — I10 Essential (primary) hypertension: Secondary | ICD-10-CM | POA: Insufficient documentation

## 2011-11-03 DIAGNOSIS — I70219 Atherosclerosis of native arteries of extremities with intermittent claudication, unspecified extremity: Secondary | ICD-10-CM

## 2011-11-03 HISTORY — PX: ABDOMINAL AORTAGRAM: SHX5454

## 2011-11-03 SURGERY — ABDOMINAL AORTAGRAM
Anesthesia: LOCAL

## 2011-11-03 MED ORDER — SODIUM CHLORIDE 0.9 % IJ SOLN
3.0000 mL | Freq: Two times a day (BID) | INTRAMUSCULAR | Status: DC
  Administered 2011-11-03: 3 mL via INTRAVENOUS

## 2011-11-03 MED ORDER — SODIUM CHLORIDE 0.9 % IV SOLN: INTRAVENOUS | Status: AC

## 2011-11-03 MED ORDER — MIDAZOLAM HCL 2 MG/2ML IJ SOLN
INTRAMUSCULAR | Status: AC
  Filled 2011-11-03: qty 2

## 2011-11-03 MED ORDER — ONDANSETRON HCL 4 MG/2ML IJ SOLN: 4.0000 mg | Freq: Four times a day (QID) | INTRAMUSCULAR | Status: DC | PRN

## 2011-11-03 MED ORDER — SODIUM CHLORIDE 0.9 % IV SOLN: 250.0000 mL | INTRAVENOUS | Status: DC | PRN

## 2011-11-03 MED ORDER — LIDOCAINE HCL (PF) 1 % IJ SOLN
INTRAMUSCULAR | Status: AC
  Filled 2011-11-03: qty 30

## 2011-11-03 MED ORDER — HEPARIN (PORCINE) IN NACL 2-0.9 UNIT/ML-% IJ SOLN
INTRAMUSCULAR | Status: AC
  Filled 2011-11-03: qty 1000

## 2011-11-03 MED ORDER — ASPIRIN 81 MG PO CHEW
324.0000 mg | CHEWABLE_TABLET | ORAL | Status: AC
  Administered 2011-11-03: 324 mg via ORAL
  Filled 2011-11-03: qty 4

## 2011-11-03 MED ORDER — SODIUM CHLORIDE 0.9 % IV SOLN
INTRAVENOUS | Status: DC
  Administered 2011-11-03: 07:00:00 via INTRAVENOUS

## 2011-11-03 MED ORDER — SODIUM CHLORIDE 0.9 % IJ SOLN: 3.0000 mL | INTRAMUSCULAR | Status: DC | PRN

## 2011-11-03 MED ORDER — HEPARIN SODIUM (PORCINE) 1000 UNIT/ML IJ SOLN
INTRAMUSCULAR | Status: AC
  Filled 2011-11-03: qty 1

## 2011-11-03 MED ORDER — ACETAMINOPHEN 325 MG PO TABS: 650.0000 mg | ORAL_TABLET | ORAL | Status: DC | PRN

## 2011-11-03 MED ORDER — FENTANYL CITRATE 0.05 MG/ML IJ SOLN
INTRAMUSCULAR | Status: AC
  Filled 2011-11-03: qty 2

## 2011-11-03 NOTE — H&P (View-Only) (Signed)
  HPI  [name] who was referred by Dr. Katz for evaluation and management of claudication and peripheral arterial disease. The patient has known history of coronary artery disease status post CABG. She underwent cardiac catheterization in February, 2012 via the left femoral artery which showed patent grafts. There was 50% distal left main. This was stable. The LIMA is atretic. She's not having chest pain or shortness of breath.  She describes severe discomfort mostly in the buttock and thigh area in the right side but not much on the left. This happens after walking only a short distance even when she is doing activities of daily living. She is known to have right iliac disease. Most of her cardiac catheterization were performed via the left femoral artery for that reason. Last catheterization in 2012 was complicated by large hematoma which did not require blood transfusion. Cardiac catheterization in 2012 showed a 70% stenosis in the right iliac artery as well as a 95% stenosis in the right renal artery.   Allergies  Allergen Reactions  . Sulfonamide Derivatives     REACTION: Rash     Current Outpatient Prescriptions on File Prior to Visit  Medication Sig Dispense Refill  . ALPRAZolam (XANAX) 0.25 MG tablet Take 0.25 mg by mouth at bedtime as needed.        . amLODipine (NORVASC) 5 MG tablet Take 5 mg by mouth daily.        . aspirin 81 MG tablet Take 81 mg by mouth daily.        . atorvastatin (LIPITOR) 20 MG tablet Take 20 mg by mouth daily.        . clopidogrel (PLAVIX) 75 MG tablet Take 75 mg by mouth daily.       . imipramine (TOFRANIL) 25 MG tablet Take 25 mg by mouth daily.        . isosorbide mononitrate (IMDUR) 60 MG 24 hr tablet Take 60 mg by mouth daily.       . metoprolol (TOPROL-XL) 50 MG 24 hr tablet Take 75 mg by mouth daily.        . nitroGLYCERIN (NITROSTAT) 0.4 MG SL tablet as directed.        . olmesartan (BENICAR) 20 MG tablet Take 20 mg  by mouth daily.      . Omega-3 Fatty Acids (FISH OIL) 1000 MG CAPS Take 2 capsules by mouth 2 (two) times daily.        . docusate sodium (COLACE) 100 MG capsule Take 100 mg by mouth as needed.         Past Medical History  Diagnosis Date  . Hypertension     Unspecified   . Hyperlipidemia, mixed   . S/P CABG (coronary artery bypass graft)   . CAD (coronary artery disease)     Cath 2008, [place] atretic, 60% Left main  /   cath  06/02/2010..no change, 50% distal L Main, patent SVGs to OM and RCA  . Bradycardia     sinus, assymptomatic  . PAD (peripheral artery disease)     right renal artery stenosis and right common  iliac stenosis  . COPD (chronic obstructive pulmonary disease)   . TIA (transient ischemic attack)     remote  . Hematoma     Readmitted 06/03/2010 post cath for L groin hematoma. ,, stabilized with no compression...home  . Ejection fraction     65%, cath, 05/2010  . Tachycardia     June,   2012  . Claudication     April, 2013     History reviewed. No pertinent past surgical history.   Family History  Problem Relation Age of Onset  . Coronary artery disease       History   Social History  . Marital Status: Married    Spouse Name: N/A    Number of Children: N/A  . Years of Education: N/A   Occupational History  . Retired    Social History Main Topics  . Smoking status: Former Smoker -- 1.0 packs/day for 35 years    Types: Cigarettes    Quit date: 04/26/2000  . Smokeless tobacco: Never Used  . Alcohol Use: No  . Drug Use: Not on file  . Sexually Active: Not on file   Other Topics Concern  . Not on file   Social History Narrative  . No narrative on file     ROS Constitutional: Negative for fever, chills, diaphoresis, activity change, appetite change and fatigue.  HENT: Negative for hearing loss, nosebleeds, congestion, sore throat, facial swelling, drooling, trouble swallowing, neck pain, voice change, sinus pressure and tinnitus.  Eyes:  Negative for photophobia, pain, discharge and visual disturbance.  Respiratory: Negative for apnea, cough, chest tightness, shortness of breath and wheezing.  Cardiovascular: Negative for chest pain, palpitations and leg swelling.  Gastrointestinal: Negative for nausea, vomiting, abdominal pain, diarrhea, constipation, blood in stool and abdominal distention.  Genitourinary: Negative for dysuria, urgency, frequency, hematuria and decreased urine volume.  Skin: Negative for color change, pallor, rash and wound.  Neurological: Negative for dizziness, tremors, seizures, syncope, speech difficulty, weakness, light-headedness, numbness and headaches.  Psychiatric/Behavioral: Negative for suicidal ideas, hallucinations, behavioral problems and agitation. The patient is not nervous/anxious.     PHYSICAL EXAM   BP 106/66  Pulse 68  Resp 16  Ht 5' 1" (1.549 m)  Wt 139 lb (63.05 kg)  BMI 26.26 kg/m2  Constitutional: She is oriented to person, place, and time. She appears well-developed and well-nourished. No distress.  HENT: No nasal discharge.  Head: Normocephalic and atraumatic.  Eyes: Pupils are equal and round. Right eye exhibits no discharge. Left eye exhibits no discharge.  Neck: Normal range of motion. Neck supple. No JVD present. No thyromegaly present.  Cardiovascular: Normal rate, regular rhythm, normal heart sounds. Exam reveals no gallop and no friction rub. No murmur heard.  Pulmonary/Chest: Effort normal and breath sounds normal. No stridor. No respiratory distress. She has no wheezes. She has no rales. She exhibits no tenderness.  Abdominal: Soft. Bowel sounds are normal. She exhibits no distension. There is no tenderness. There is no rebound and no guarding.  Musculoskeletal: Normal range of motion. She exhibits no edema and no tenderness.  Neurological: She is alert and oriented to person, place, and time. Coordination normal.  Skin: Skin is warm and dry. No rash noted. She is  not diaphoretic. No erythema. No pallor.  Psychiatric: She has a normal mood and affect. Her behavior is normal. Judgment and thought content normal.  Vascular: Right femoral pulse: Absent on the right side and +2 left side with a bruit. DP/PT: Absent on the right side and +1 on the left.    ASSESSMENT AND PLAN   

## 2011-11-03 NOTE — CV Procedure (Signed)
PERIPHERAL VASCULAR PROCEDURE  NAME:  Linda [name]   MRN: [id number] DOB:  Jan 14, 1941   ADMIT DATE: 11/03/2011  Performing Cardiologist: Lorine Bears Primary Physician: Kirstie Peri, MD Primary Cardiologist:  Jerral Bonito, MD   Procedures Performed:  Abdominal Aortic Angiogram with Bi-Iliofemoral Runoff  Bilateral Lower Extremity Angiography (1st Order)  Right common iliac angioplasty.  Right common iliac stent placement.  Indication(s):  Claudication   Consent: The procedure with Risks/Benefits/Alternatives and Indications was reviewed with the patient .  All questions were answered.  Medications:  Sedation:  2 mg IV Versed, 50 mcg IV Fentanyl  Contrast:  154 mL Omnipaque  Procedural details: The left groin was prepped, draped, and anesthetized with 1% lidocaine. Using modified Seldinger technique, a 5 French sheath was introduced into the left common femoral artery. A 5 Fr Short Pigtail Catheter was advanced of over a Versicore wire into the descending Aorta to a level just above the renal arteries. A power injection of 64ml/sec contrast over 1 sec was performed for Abdominal Aortic Angiography.  The catheter was then pulled back to a level just above the Aortic bifurcation, and a second power injection was performed to evaluate bi-ileiofemoral arteries with runnoff.   Interventional Procedure:  The patient was noted to have severe right common iliac stenosis. Decided to proceed with angioplasty.The right groin was anesthetized with 1% lidocaine. There was no palpable pulse on the right side. Thus, Smart needle was used to gain access. A 6 French Bright tip catheter was used. A significant gradient was noted across the stenosis. 4000 units of unfractionated heparin was given. ACT was 240. The lesion was predilated with a 6 x 30 mm balloon to 6 atmospheres. There was a dissection noted at the site of stenosis. Thus, I used an 8 x 29 mm Omnilink balloon expandable stent  which was deployed to 6 atmospheres with excellent results. Final angiography showed no residual stenosis or dissection. No residual gradient was noted.  Hemodynamics:  Central Aortic Pressure / Mean Aortic Pressure: 130/58  Findings:  Abdominal aorta: The infrarenal aorta has moderate atherosclerosis without significant aneurysm or stenosis.  Left renal artery: Small with 95% ostial stenosis. The left kidney appears to be very small in size and possibly nonviable.  Right renal artery: Mild ostial stenosis no more than 20%.  Celiac artery: Patent  Superior mesenteric artery: Patent  Right common iliac artery: There is a 20% ostial stenosis. A 90% calcified stenosis is noted in the mid area.  Right internal iliac artery: Normal.  Right external iliac artery: Normal.  Right common femoral artery: Normal.  Right profunda femoral artery: Normal.  Right superficial femoral artery: Normal  Right popliteal artery: Minor irregularities.  Right tibial peroneal trunk: Normal.  Three-vessel runoff on the right side but not well visualized the foot due to motion.  Left common iliac artery:  20% ostial stenosis.  Left internal iliac artery: Normal.   Left external iliac artery: Normal.  Left common femoral artery: Normal.  Left profunda femoral artery: 30-40% ostial stenosis.  Left superficial femoral artery:  Normal.  Left popliteal artery: Normal.  Left tibial peroneal trunk: Normal.  Left anterior tibial artery: Normal.  Left peroneal artery: Normal.  Left posterior tibial artery: Normal  Conclusions: 1. Severe ostial left renal artery stenosis. However, the artery is very small in the left kidney appears to be very small and probably nonviable. 2. Severe right common iliac artery stenosis (90%). 3. No significant infrainguinal disease. 4. Successful  angioplasty and stent placement to the right common iliac artery.  Recommendations:  Continue aggressive medical  therapy. Renal ultrasound can be considered to evaluate left renal size.   Lorine Bears, MD, [facility] 11/03/2011 9:42 AM

## 2011-11-03 NOTE — Interval H&P Note (Signed)
History and Physical Interval Note:  11/03/2011 8:45 AM  Linda [name]  has presented today for surgery, with the diagnosis of PVD  The various methods of treatment have been discussed with the patient and family. After consideration of risks, benefits and other options for treatment, the patient has consented to  Procedure(s) (LRB): ABDOMINAL AORTAGRAM (N/A) as a surgical intervention .  The patient's history has been reviewed, patient examined, no change in status, stable for surgery.  I have reviewed the patients' chart and labs.  Questions were answered to the patient's satisfaction.     Lorine Bears

## 2011-11-15 ENCOUNTER — Other Ambulatory Visit: Payer: Self-pay | Admitting: Cardiology

## 2011-11-15 DIAGNOSIS — I739 Peripheral vascular disease, unspecified: Secondary | ICD-10-CM

## 2011-11-18 ENCOUNTER — Encounter (INDEPENDENT_AMBULATORY_CARE_PROVIDER_SITE_OTHER): Payer: Medicare Other

## 2011-11-18 DIAGNOSIS — I739 Peripheral vascular disease, unspecified: Secondary | ICD-10-CM

## 2011-11-24 ENCOUNTER — Ambulatory Visit (INDEPENDENT_AMBULATORY_CARE_PROVIDER_SITE_OTHER): Payer: Medicare Other | Admitting: Cardiovascular Disease

## 2011-11-24 ENCOUNTER — Encounter: Payer: Self-pay | Admitting: Cardiovascular Disease

## 2011-11-24 VITALS — BP 106/60 | HR 67 | Ht 61.0 in | Wt 141.0 lb

## 2011-11-24 DIAGNOSIS — I701 Atherosclerosis of renal artery: Secondary | ICD-10-CM

## 2011-11-24 DIAGNOSIS — I739 Peripheral vascular disease, unspecified: Secondary | ICD-10-CM

## 2011-11-24 NOTE — Patient Instructions (Addendum)
Your physician wants you to follow-up in: 6 months. You will receive a reminder letter in the mail two months in advance. If you don't receive a letter, please call our office to schedule the follow-up appointment.  Your physician has requested that you have an abdominal aorta duplex. During this test, an ultrasound is used to evaluate the aorta. Allow 30 minutes for this exam. Do not eat after midnight the day before and avoid carbonated beverages. To be done in 3 months.

## 2011-12-01 ENCOUNTER — Ambulatory Visit: Payer: Medicare Other

## 2011-12-01 ENCOUNTER — Ambulatory Visit (INDEPENDENT_AMBULATORY_CARE_PROVIDER_SITE_OTHER): Payer: Medicare Other

## 2011-12-01 DIAGNOSIS — I498 Other specified cardiac arrhythmias: Secondary | ICD-10-CM

## 2011-12-01 NOTE — Progress Notes (Deleted)
EKG done in error.  Wrong patient.

## 2011-12-06 ENCOUNTER — Encounter: Payer: Self-pay | Admitting: Cardiovascular Disease

## 2011-12-06 NOTE — Assessment & Plan Note (Signed)
There is a 95% stenosis in the left renal artery. This was described on previous angiography few years ago and thus it is not a new finding. Also the left kidney size seems to be very small and likely nonviable in function. She has no indication for revascularization given the lack of significant hypertension or chronic kidney disease.

## 2011-12-06 NOTE — Progress Notes (Signed)
HPI  This is a 71 year old pleasant female who is here today for a followup visit regarding claudication and peripheral arterial disease. The patient has known history of coronary artery disease status post CABG. She underwent cardiac catheterization in February, 2012 via the left femoral artery which showed patent grafts. There was 50% distal left main. This was stable. The LIMA is atretic. She's not having chest pain or shortness of breath.  She reported severe discomfort mostly in the buttock and thigh area in the right side but not much on the left. Her right femoral artery pulse was absent. She underwent angiography which showed 90% stenosis in the right common iliac artery. There was a 95% left renal artery stenosis which was described on previous angiography 2 years ago. The left renal artery appeared small with very small left kidney size. She underwent successful angioplasty and stent placement to the right common iliac artery without complications. She reports complete resolution of her claudication and feels significantly better.  Allergies  Allergen Reactions  . Sulfonamide Derivatives     REACTION: Rash     Current Outpatient Prescriptions on File Prior to Visit  Medication Sig Dispense Refill  . ALPRAZolam (XANAX) 0.25 MG tablet Take 0.25 mg by mouth at bedtime as needed. For sleep      . amLODipine (NORVASC) 5 MG tablet Take 5 mg by mouth daily.        Marland Kitchen aspirin 81 MG tablet Take 81 mg by mouth daily.        Marland Kitchen atorvastatin (LIPITOR) 20 MG tablet Take 20 mg by mouth daily.        . clopidogrel (PLAVIX) 75 MG tablet Take 75 mg by mouth daily.       Marland Kitchen docusate sodium (COLACE) 100 MG capsule Take 100 mg by mouth daily as needed. For constipations      . imipramine (TOFRANIL) 25 MG tablet Take 25 mg by mouth daily.        . isosorbide mononitrate (IMDUR) 60 MG 24 hr tablet Take 60 mg by mouth daily.       . metoprolol (TOPROL-XL) 50 MG 24 hr tablet Take 75 mg by mouth daily.         . nitroGLYCERIN (NITROSTAT) 0.4 MG SL tablet Place 0.4 mg under the tongue every 5 (five) minutes as needed. For chest pain      . olmesartan (BENICAR) 20 MG tablet Take 20 mg by mouth daily.      . Omega-3 Fatty Acids (FISH OIL) 1000 MG CAPS Take 2 capsules by mouth 2 (two) times daily.           Past Medical History  Diagnosis Date  . Hypertension     Unspecified   . Hyperlipidemia, mixed   . S/P CABG (coronary artery bypass graft)   . CAD (coronary artery disease)     Cath 2008, [place] atretic, 60% Left main  /   cath  06/02/2010..no change, 50% distal L Main, patent SVGs to OM and RCA  . Bradycardia     sinus, assymptomatic  . PAD (peripheral artery disease)     95% left renal artery stenosis and 90% right common  iliac stenosis. Status post stent placement to the right common iliac artery in July 2013 with an 8 x 29 mm stent  . COPD (chronic obstructive pulmonary disease)   . TIA (transient ischemic attack)     remote  . Hematoma     Readmitted 06/03/2010 post  cath for L groin hematoma. ,, stabilized with no compression...home  . Ejection fraction     65%, cath, 05/2010  . Tachycardia     June, 2012  . Claudication     April, 2013     No past surgical history on file.   Family History  Problem Relation Age of Onset  . Coronary artery disease       History   Social History  . Marital Status: Married    Spouse Name: N/A    Number of Children: N/A  . Years of Education: N/A   Occupational History  . Retired    Social History Main Topics  . Smoking status: Former Smoker -- 1.0 packs/day for 35 years    Types: Cigarettes    Quit date: 04/26/2000  . Smokeless tobacco: Never Used  . Alcohol Use: No  . Drug Use: Not on file  . Sexually Active: Not on file   Other Topics Concern  . Not on file   Social History Narrative  . No narrative on file     PHYSICAL EXAM   BP 106/60  Pulse 67  Ht 5\' 1"  (1.549 m)  Wt 141 lb (63.957 kg)  BMI 26.64 kg/m2  SpO2  94%  Constitutional: She is oriented to person, place, and time. She appears well-developed and well-nourished. No distress.  HENT: No nasal discharge.  Head: Normocephalic and atraumatic.  Eyes: Pupils are equal and round. Right eye exhibits no discharge. Left eye exhibits no discharge.  Neck: Normal range of motion. Neck supple. No JVD present. No thyromegaly present.  Cardiovascular: Normal rate, regular rhythm, normal heart sounds. Exam reveals no gallop and no friction rub. No murmur heard.  Pulmonary/Chest: Effort normal and breath sounds normal. No stridor. No respiratory distress. She has no wheezes. She has no rales. She exhibits no tenderness.  Abdominal: Soft. Bowel sounds are normal. She exhibits no distension. There is no tenderness. There is no rebound and no guarding.  Musculoskeletal: Normal range of motion. She exhibits no edema and no tenderness.  Neurological: She is alert and oriented to person, place, and time. Coordination normal.  Skin: Skin is warm and dry. No rash noted. She is not diaphoretic. No erythema. No pallor.  Psychiatric: She has a normal mood and affect. Her behavior is normal. Judgment and thought content normal.  Femoral pulses are +1 bilaterally with no hematoma.    ASSESSMENT AND PLAN

## 2011-12-06 NOTE — Assessment & Plan Note (Signed)
The patient had significant lifestyle limiting claudication in the buttock and thigh area right worse than left with moderately reduced ABI on the right side at 0.59 .  She underwent successful angioplasty and stent placement to the severe right common iliac artery stenosis. Her claudication has completely resolved. She is feeling much better at this time. Postprocedure ABI was normal on the right side. I recommend an aortoiliac duplex and ABI in 3 months. Followup with me in 6 months.

## 2011-12-31 ENCOUNTER — Encounter: Payer: Medicare Other | Admitting: Physician Assistant

## 2012-01-03 ENCOUNTER — Ambulatory Visit: Payer: Medicare Other | Admitting: Internal Medicine

## 2012-02-14 ENCOUNTER — Ambulatory Visit: Payer: Medicare Other | Admitting: Cardiology

## 2012-03-02 ENCOUNTER — Encounter (INDEPENDENT_AMBULATORY_CARE_PROVIDER_SITE_OTHER): Payer: Medicare Other

## 2012-03-02 ENCOUNTER — Other Ambulatory Visit: Payer: Self-pay | Admitting: Cardiology

## 2012-03-02 DIAGNOSIS — I739 Peripheral vascular disease, unspecified: Secondary | ICD-10-CM

## 2012-03-02 DIAGNOSIS — I700 Atherosclerosis of aorta: Secondary | ICD-10-CM

## 2012-03-09 ENCOUNTER — Encounter: Payer: Self-pay | Admitting: Cardiology

## 2012-03-09 DIAGNOSIS — I701 Atherosclerosis of renal artery: Secondary | ICD-10-CM | POA: Insufficient documentation

## 2012-03-09 DIAGNOSIS — I739 Peripheral vascular disease, unspecified: Secondary | ICD-10-CM | POA: Insufficient documentation

## 2012-03-10 ENCOUNTER — Ambulatory Visit (INDEPENDENT_AMBULATORY_CARE_PROVIDER_SITE_OTHER): Payer: Medicare Other | Admitting: Cardiology

## 2012-03-10 ENCOUNTER — Encounter: Payer: Self-pay | Admitting: Cardiology

## 2012-03-10 VITALS — BP 126/78 | HR 76 | Ht 61.0 in | Wt 143.8 lb

## 2012-03-10 DIAGNOSIS — I701 Atherosclerosis of renal artery: Secondary | ICD-10-CM

## 2012-03-10 DIAGNOSIS — R000 Tachycardia, unspecified: Secondary | ICD-10-CM

## 2012-03-10 DIAGNOSIS — I739 Peripheral vascular disease, unspecified: Secondary | ICD-10-CM

## 2012-03-10 DIAGNOSIS — I498 Other specified cardiac arrhythmias: Secondary | ICD-10-CM

## 2012-03-10 DIAGNOSIS — I10 Essential (primary) hypertension: Secondary | ICD-10-CM

## 2012-03-10 DIAGNOSIS — R0989 Other specified symptoms and signs involving the circulatory and respiratory systems: Secondary | ICD-10-CM

## 2012-03-10 DIAGNOSIS — E782 Mixed hyperlipidemia: Secondary | ICD-10-CM

## 2012-03-10 DIAGNOSIS — R001 Bradycardia, unspecified: Secondary | ICD-10-CM

## 2012-03-10 DIAGNOSIS — I2510 Atherosclerotic heart disease of native coronary artery without angina pectoris: Secondary | ICD-10-CM

## 2012-03-10 NOTE — Assessment & Plan Note (Signed)
Blood pressure is controlled. No change in therapy. 

## 2012-03-10 NOTE — Assessment & Plan Note (Signed)
Patient has a soft right carotid bruit. She has known significant vascular disease. I have reviewed the records carefully. I cannot find a carotid Doppler done over the past several years. This will be scheduled.

## 2012-03-10 NOTE — Assessment & Plan Note (Signed)
The patient's lipids are being treated.  No change in therapy. 

## 2012-03-10 NOTE — Patient Instructions (Addendum)
Your physician recommends that you schedule a follow-up appointment in: 6 months. You will receive a reminder letter in the mail in about 4 months reminding you to call and schedule your appointment. If you don't receive this letter, please contact our office. Your physician recommends that you continue on your current medications as directed. Please refer to the Current Medication list given to you today.  Your physician has requested that you have a carotid duplex in January 2014. This test is an ultrasound of the carotid arteries in your neck. It looks at blood flow through these arteries that supply the brain with blood. Allow one hour for this exam. There are no restrictions or special instructions.

## 2012-03-10 NOTE — Assessment & Plan Note (Signed)
The patient has known renal artery stenosis. The kidney on that side is small. It does not appear to be causing any difficulties. No further workup at this time.

## 2012-03-10 NOTE — Assessment & Plan Note (Signed)
Her symptoms are greatly improved since she received a stent to the right common iliac artery in July, 2013.

## 2012-03-10 NOTE — Assessment & Plan Note (Signed)
She is not having any symptoms of tachycardia. No further workup.

## 2012-03-10 NOTE — Assessment & Plan Note (Signed)
She is not having any symptomatic bradycardia.  No further workup 

## 2012-03-10 NOTE — Assessment & Plan Note (Signed)
Coronary disease is stable. She underwent catheterization in February, 2012. There was no change in her 50% distal left main. Vein grafts were patent. Her LIMA is atretic. She is stable. No further workup.

## 2012-03-10 NOTE — Progress Notes (Signed)
Patient ID: Linda [name], female   DOB: [birth year], 71 y.o.   MRN: [id number]   HPI  Patient is seen for followup of coronary disease and hypertension and peripheral vascular disease. I saw her last in April, 2013. Since that time she has been treated successfully by Dr. Kirke Corin. She has had stenting 2 vessels in her legs. She is greatly improved. Her activity level has improved significantly. She's not having any chest pain or shortness of breath.  Allergies  Allergen Reactions  . Sulfonamide Derivatives     REACTION: Rash    Current Outpatient Prescriptions  Medication Sig Dispense Refill  . ALPRAZolam (XANAX) 0.25 MG tablet Take 0.25 mg by mouth at bedtime as needed. For sleep      . amLODipine (NORVASC) 5 MG tablet Take 5 mg by mouth daily.        Marland Kitchen aspirin 81 MG tablet Take 81 mg by mouth daily.        Marland Kitchen atorvastatin (LIPITOR) 20 MG tablet Take 20 mg by mouth daily.        . clopidogrel (PLAVIX) 75 MG tablet Take 75 mg by mouth daily.       Marland Kitchen docusate sodium (COLACE) 100 MG capsule Take 100 mg by mouth daily as needed. For constipations      . imipramine (TOFRANIL) 25 MG tablet Take 25 mg by mouth daily.        . isosorbide mononitrate (IMDUR) 60 MG 24 hr tablet Take 60 mg by mouth daily.       . metoprolol (TOPROL-XL) 50 MG 24 hr tablet Take 75 mg by mouth daily.        . nitroGLYCERIN (NITROSTAT) 0.4 MG SL tablet Place 0.4 mg under the tongue every 5 (five) minutes as needed. For chest pain      . olmesartan (BENICAR) 20 MG tablet Take 20 mg by mouth daily.      . Omega-3 Fatty Acids (FISH OIL) 1000 MG CAPS Take 2 capsules by mouth 2 (two) times daily.          History   Social History  . Marital Status: Married    Spouse Name: N/A    Number of Children: N/A  . Years of Education: N/A   Occupational History  . Retired    Social History Main Topics  . Smoking status: Former Smoker -- 1.0 packs/day for 35 years    Types: Cigarettes    Quit date: 04/26/2000  . Smokeless  tobacco: Never Used  . Alcohol Use: No  . Drug Use: Not on file  . Sexually Active: Not on file   Other Topics Concern  . Not on file   Social History Narrative  . No narrative on file    Family History  Problem Relation Age of Onset  . Coronary artery disease      Past Medical History  Diagnosis Date  . Hypertension     Unspecified   . Hyperlipidemia, mixed   . S/P CABG (coronary artery bypass graft)   . CAD (coronary artery disease)     Cath 2008, [place] atretic, 60% Left main  /   cath  06/02/2010..no change, 50% distal L Main, patent SVGs to OM and RCA  . Bradycardia     sinus, assymptomatic  . PAD (peripheral artery disease)     95% left renal artery stenosis and 90% right common  iliac stenosis. Status post stent placement to the right common iliac artery in July 2013  with an 8 x 29 mm stent  . COPD (chronic obstructive pulmonary disease)   . TIA (transient ischemic attack)     remote  . Hematoma     Readmitted 06/03/2010 post cath for L groin hematoma. ,, stabilized with no compression...home  . Ejection fraction     65%, cath, 05/2010  . Tachycardia     June, 2012  . Claudication     April, 2013  . Renal artery stenosis     95% stenosis  known from the past., Small left kidney, medical therapy    No past surgical history on file.  Patient Active Problem List  Diagnosis  . Hypertension  . Hyperlipidemia, mixed  . S/P CABG (coronary artery bypass graft)  . CAD (coronary artery disease)  . Bradycardia  . COPD (chronic obstructive pulmonary disease)  . TIA (transient ischemic attack)  . Hematoma  . Ejection fraction  . Tachycardia  . Claudication  . PAD (peripheral artery disease)  . Renal artery stenosis    ROS   Patient denies fever, chills, headache, sweats, rash, change in vision, change in hearing, chest pain, cough, nausea vomiting, urinary symptoms. All other systems are reviewed and are negative.  PHYSICAL EXAM   Patient is oriented to person  time and place. Affect is normal. There is no jugulovenous distention. Lungs are clear. Respiratory effort is nonlabored. There is a soft right carotid bruit. Cardiac exam reveals S1 and S2. There no clicks or significant murmurs. The abdomen is soft. There is no peripheral edema. There no musculoskeletal deformities. There are no skin rashes.  Filed Vitals:   03/10/12 1318  BP: 126/78  Pulse: 76  Height: 5\' 1"  (1.549 m)  Weight: 143 lb 12.8 oz (65.227 kg)     ASSESSMENT & PLAN

## 2012-03-13 ENCOUNTER — Other Ambulatory Visit: Payer: Self-pay | Admitting: *Deleted

## 2012-03-13 DIAGNOSIS — I739 Peripheral vascular disease, unspecified: Secondary | ICD-10-CM

## 2012-05-15 ENCOUNTER — Encounter (INDEPENDENT_AMBULATORY_CARE_PROVIDER_SITE_OTHER): Payer: Self-pay | Admitting: *Deleted

## 2012-05-17 ENCOUNTER — Encounter: Payer: Self-pay | Admitting: Cardiovascular Disease

## 2012-05-17 ENCOUNTER — Ambulatory Visit (INDEPENDENT_AMBULATORY_CARE_PROVIDER_SITE_OTHER): Payer: Medicare Other | Admitting: Cardiovascular Disease

## 2012-05-17 VITALS — BP 140/80 | HR 61 | Ht 61.0 in | Wt 143.0 lb

## 2012-05-17 DIAGNOSIS — I701 Atherosclerosis of renal artery: Secondary | ICD-10-CM

## 2012-05-17 DIAGNOSIS — I739 Peripheral vascular disease, unspecified: Secondary | ICD-10-CM

## 2012-05-17 NOTE — Patient Instructions (Addendum)
Your physician has requested that you have an aorto-iliac duplex in 2 months.  . During this test, an ultrasound is used to evaluate the aorta. Allow 30 minutes for this exam. Do not eat after midnight the day before and avoid carbonated beverages  Your physician wants you to follow-up in: 6 months.   You will receive a reminder letter in the mail two months in advance. If you don't receive a letter, please call our office to schedule the follow-up appointment.

## 2012-05-20 ENCOUNTER — Encounter: Payer: Self-pay | Admitting: Cardiovascular Disease

## 2012-05-20 NOTE — Assessment & Plan Note (Signed)
This is a chronic finding with small left renal artery and the left kidney. No indication for revascularization.

## 2012-05-20 NOTE — Assessment & Plan Note (Signed)
No recurrent claudication since angioplasty and stent placement to the  right common iliac artery . Aortoiliac duplex showed evidence of mild restenosis. I recommend a followup study in 3 months.

## 2012-05-20 NOTE — Progress Notes (Signed)
HPI  This is a 72 year old pleasant female who is here today for a followup visit regarding claudication and peripheral arterial disease. The patient has known history of coronary artery disease status post CABG. She reported severe discomfort mostly in the buttock and thigh area in the right side but not much on the left. Her right femoral artery pulse was absent. She underwent angiography which showed 90% stenosis in the right common iliac artery. There was a 95% left renal artery stenosis which was described on previous angiography 2 years ago. The left renal artery appeared small with very small left kidney size. She underwent successful angioplasty and stent placement to the right common iliac artery without complications. Followup aortoiliac duplex showed mild restenosis. She has no recurrent symptoms suggestive of claudication. She is overall doing very well.  Allergies  Allergen Reactions  . Sulfonamide Derivatives     REACTION: Rash     Current Outpatient Prescriptions on File Prior to Visit  Medication Sig Dispense Refill  . ALPRAZolam (XANAX) 0.25 MG tablet Take 0.25 mg by mouth at bedtime as needed. For sleep      . amLODipine (NORVASC) 5 MG tablet Take 5 mg by mouth daily.        Marland Kitchen aspirin 81 MG tablet Take 81 mg by mouth daily.        Marland Kitchen atorvastatin (LIPITOR) 20 MG tablet Take 20 mg by mouth daily.        . clopidogrel (PLAVIX) 75 MG tablet Take 75 mg by mouth daily.       Marland Kitchen docusate sodium (COLACE) 100 MG capsule Take 100 mg by mouth daily as needed. For constipations      . imipramine (TOFRANIL) 25 MG tablet Take 25 mg by mouth daily.        . isosorbide mononitrate (IMDUR) 60 MG 24 hr tablet Take 60 mg by mouth daily.       . metoprolol (TOPROL-XL) 50 MG 24 hr tablet Take 75 mg by mouth daily.        . nitroGLYCERIN (NITROSTAT) 0.4 MG SL tablet Place 0.4 mg under the tongue every 5 (five) minutes as needed. For chest pain      . olmesartan-hydrochlorothiazide (BENICAR HCT)  40-25 MG per tablet Take 1 tablet by mouth daily.      . Omega-3 Fatty Acids (FISH OIL) 1000 MG CAPS Take 2 capsules by mouth 2 (two) times daily.           Past Medical History  Diagnosis Date  . Hypertension     Unspecified   . Hyperlipidemia, mixed   . S/P CABG (coronary artery bypass graft)   . CAD (coronary artery disease)     Cath 2008, [facility], 60% Left main  /   cath  06/02/2010..no change, 50% distal L Main, patent SVGs to OM and RCA  . Bradycardia     sinus, assymptomatic  . PAD (peripheral artery disease)     95% left renal artery stenosis and 90% right common  iliac stenosis. Status post stent placement to the right common iliac artery in July 2013 with an 8 x 29 mm stent  . COPD (chronic obstructive pulmonary disease)   . TIA (transient ischemic attack)     remote  . Hematoma     Readmitted 06/03/2010 post cath for L groin hematoma. ,, stabilized with no compression...home  . Ejection fraction     65%, cath, 05/2010  . Tachycardia     June,  2012  . Claudication     April, 2013  . Renal artery stenosis     95% stenosis  known from the past., Small left kidney, medical therapy  . Carotid bruit     November, 2013     No past surgical history on file.   Family History  Problem Relation Age of Onset  . Coronary artery disease       History   Social History  . Marital Status: Married    Spouse Name: N/A    Number of Children: N/A  . Years of Education: N/A   Occupational History  . Retired    Social History Main Topics  . Smoking status: Former Smoker -- 1.0 packs/day for 35 years    Types: Cigarettes    Quit date: 04/26/2000  . Smokeless tobacco: Never Used  . Alcohol Use: No  . Drug Use: Not on file  . Sexually Active: Not on file   Other Topics Concern  . Not on file   Social History Narrative  . No narrative on file     PHYSICAL EXAM   BP 140/80  Pulse 61  Ht 5\' 1"  (1.549 m)  Wt 143 lb (64.864 kg)  BMI 27.02  kg/m2  Constitutional: She is oriented to person, place, and time. She appears well-developed and well-nourished. No distress.  HENT: No nasal discharge.  Head: Normocephalic and atraumatic.  Eyes: Pupils are equal and round. Right eye exhibits no discharge. Left eye exhibits no discharge.  Neck: Normal range of motion. Neck supple. No JVD present. No thyromegaly present.  Cardiovascular: Normal rate, regular rhythm, normal heart sounds. Exam reveals no gallop and no friction rub. No murmur heard.  Pulmonary/Chest: Effort normal and breath sounds normal. No stridor. No respiratory distress. She has no wheezes. She has no rales. She exhibits no tenderness.  Abdominal: Soft. Bowel sounds are normal. She exhibits no distension. There is no tenderness. There is no rebound and no guarding.  Musculoskeletal: Normal range of motion. She exhibits no edema and no tenderness.  Neurological: She is alert and oriented to person, place, and time. Coordination normal.  Skin: Skin is warm and dry. No rash noted. She is not diaphoretic. No erythema. No pallor.  Psychiatric: She has a normal mood and affect. Her behavior is normal. Judgment and thought content normal.  Femoral pulses are +1 bilaterally with no hematoma.  EKG: Normal sinus rhythm.  ASSESSMENT AND PLAN

## 2012-06-29 ENCOUNTER — Encounter (INDEPENDENT_AMBULATORY_CARE_PROVIDER_SITE_OTHER): Payer: Medicare Other

## 2012-06-29 DIAGNOSIS — I739 Peripheral vascular disease, unspecified: Secondary | ICD-10-CM

## 2012-07-05 ENCOUNTER — Encounter (INDEPENDENT_AMBULATORY_CARE_PROVIDER_SITE_OTHER): Payer: Medicare Other

## 2012-07-05 DIAGNOSIS — I6529 Occlusion and stenosis of unspecified carotid artery: Secondary | ICD-10-CM

## 2012-07-05 DIAGNOSIS — R0989 Other specified symptoms and signs involving the circulatory and respiratory systems: Secondary | ICD-10-CM

## 2012-07-14 ENCOUNTER — Telehealth: Payer: Self-pay | Admitting: *Deleted

## 2012-07-14 ENCOUNTER — Encounter: Payer: Self-pay | Admitting: Cardiology

## 2012-07-14 DIAGNOSIS — I739 Peripheral vascular disease, unspecified: Secondary | ICD-10-CM

## 2012-07-14 NOTE — Telephone Encounter (Signed)
Patient informed via voicemail.

## 2012-07-14 NOTE — Telephone Encounter (Signed)
Message copied by Eustace Moore on Fri Jul 14, 2012  4:51 PM ------      Message from: Myrtis Ser, [place] D      Created: Fri Jul 14, 2012 12:47 PM       Please n carotid Doppler looked good. ------

## 2012-09-25 ENCOUNTER — Encounter: Payer: Self-pay | Admitting: Cardiology

## 2012-09-29 ENCOUNTER — Ambulatory Visit (INDEPENDENT_AMBULATORY_CARE_PROVIDER_SITE_OTHER): Payer: Medicare Other | Admitting: Cardiology

## 2012-09-29 ENCOUNTER — Encounter: Payer: Self-pay | Admitting: Cardiology

## 2012-09-29 VITALS — BP 129/76 | HR 68 | Ht 61.0 in | Wt 147.8 lb

## 2012-09-29 DIAGNOSIS — I779 Disorder of arteries and arterioles, unspecified: Secondary | ICD-10-CM

## 2012-09-29 DIAGNOSIS — I10 Essential (primary) hypertension: Secondary | ICD-10-CM

## 2012-09-29 DIAGNOSIS — I739 Peripheral vascular disease, unspecified: Secondary | ICD-10-CM

## 2012-09-29 DIAGNOSIS — I2510 Atherosclerotic heart disease of native coronary artery without angina pectoris: Secondary | ICD-10-CM

## 2012-09-29 NOTE — Progress Notes (Signed)
HPI  Patient returns today and she is doing well. She has not had any chest pain. Actually her original symptom was throat discomfort and arm pain. She has not had any of this. She is following with Dr. Kirke Corin concerning her peripheral vascular disease. With her initial intervention she had marked improvement. She says she has had some discomfort in her leg on only one occasion when walking excessively on a vacation.  She admits that she has gained some weight. She could do a better job with her calories.    Allergies  Allergen Reactions  . Sulfonamide Derivatives     REACTION: Rash    Current Outpatient Prescriptions  Medication Sig Dispense Refill  . ALPRAZolam (XANAX) 0.25 MG tablet Take 0.25 mg by mouth at bedtime as needed. For sleep      . amLODipine (NORVASC) 5 MG tablet Take 5 mg by mouth daily.        Marland Kitchen aspirin 81 MG tablet Take 81 mg by mouth daily.        Marland Kitchen atorvastatin (LIPITOR) 20 MG tablet Take 20 mg by mouth daily.        . clopidogrel (PLAVIX) 75 MG tablet Take 75 mg by mouth daily.       Marland Kitchen docusate sodium (COLACE) 100 MG capsule Take 100 mg by mouth daily as needed. For constipations      . imipramine (TOFRANIL) 25 MG tablet Take 25 mg by mouth daily.        . isosorbide mononitrate (IMDUR) 60 MG 24 hr tablet Take 60 mg by mouth daily.       . metoprolol (TOPROL-XL) 50 MG 24 hr tablet Take 75 mg by mouth daily.        . nitroGLYCERIN (NITROSTAT) 0.4 MG SL tablet Place 0.4 mg under the tongue every 5 (five) minutes as needed. For chest pain      . olmesartan-hydrochlorothiazide (BENICAR HCT) 40-25 MG per tablet Take 1 tablet by mouth daily.      . Omega-3 Fatty Acids (FISH OIL) 1000 MG CAPS Take 2 capsules by mouth 2 (two) times daily.         No current facility-administered medications for this visit.    History   Social History  . Marital Status: Married    Spouse Name: N/A    Number of Children: N/A  . Years of Education: N/A   Occupational History  .  Retired    Social History Main Topics  . Smoking status: Former Smoker -- 1.00 packs/day for 35 years    Types: Cigarettes    Quit date: 04/26/2000  . Smokeless tobacco: Never Used  . Alcohol Use: No  . Drug Use: Not on file  . Sexually Active: Not on file   Other Topics Concern  . Not on file   Social History Narrative  . No narrative on file    Family History  Problem Relation Age of Onset  . Coronary artery disease      Past Medical History  Diagnosis Date  . Hypertension     Unspecified   . Hyperlipidemia, mixed   . S/P CABG (coronary artery bypass graft)   . CAD (coronary artery disease)     Cath 2008, [facility], 60% Left main  /   cath  06/02/2010..no change, 50% distal L Main, patent SVGs to OM and RCA  . Bradycardia     sinus, assymptomatic  . PAD (peripheral artery disease)     95%  left renal artery stenosis and 90% right common  iliac stenosis. Status post stent placement to the right common iliac artery in July 2013 with an 8 x 29 mm stent  . COPD (chronic obstructive pulmonary disease)   . TIA (transient ischemic attack)     remote  . Hematoma     Readmitted 06/03/2010 post cath for L groin hematoma. ,, stabilized with no compression...home  . Ejection fraction     65%, cath, 05/2010  . Tachycardia     June, 2012  . Claudication     April, 2013  . Renal artery stenosis     95% stenosis  known from the past., Small left kidney, medical therapy  . Carotid artery disease     November, 2013  //   Doppler, March, 2014, stable, 0-39% bilateral, followup 2 years    History reviewed. No pertinent past surgical history.  Patient Active Problem List   Diagnosis Date Noted  . Carotid artery disease   . Carotid bruit   . PAD (peripheral artery disease)   . Renal artery stenosis   . Claudication   . Tachycardia   . Hypertension   . Hyperlipidemia, mixed   . S/P CABG (coronary artery bypass graft)   . CAD (coronary artery disease)   . Bradycardia   .  COPD (chronic obstructive pulmonary disease)   . TIA (transient ischemic attack)   . Hematoma   . Ejection fraction     ROS   Patient denies fever, chills, headache, sweats, rash, change in vision, change in hearing, chest pain, cough, nausea vomiting, urinary symptoms. All other systems are reviewed and are negative.  PHYSICAL EXAM  Patient is oriented to person time and place. Affect is normal. There is no jugulovenous distention. Lungs are clear. Respiratory effort is nonlabored. Cardiac exam reveals S1 and S2. There no clicks or significant murmurs. The abdomen is soft. There is no peripheral edema.   Filed Vitals:   09/29/12 1039  BP: 129/76  Pulse: 68  Height: 5\' 1"  (1.549 m)  Weight: 147 lb 12.8 oz (67.042 kg)  SpO2: 94%     ASSESSMENT & PLAN

## 2012-09-29 NOTE — Assessment & Plan Note (Signed)
Coronary disease is stable. Last cath was February, 2012. She is stable. No further workup needed.

## 2012-09-29 NOTE — Assessment & Plan Note (Signed)
Her blood pressure is controlled. No change in therapy. 

## 2012-09-29 NOTE — Assessment & Plan Note (Signed)
Her peripheral arterial disease is followed carefully by Dr. Kirke Corin. No further workup.

## 2012-09-29 NOTE — Patient Instructions (Addendum)

## 2012-09-29 NOTE — Assessment & Plan Note (Signed)
She does have minimal carotid disease. After I saw her last a carotid Doppler was range. She has 0-39% bilateral disease with recommendation for followup in 2 years.

## 2012-10-17 ENCOUNTER — Ambulatory Visit (INDEPENDENT_AMBULATORY_CARE_PROVIDER_SITE_OTHER): Payer: Medicare Other | Admitting: Cardiovascular Disease

## 2012-10-17 ENCOUNTER — Encounter: Payer: Self-pay | Admitting: Cardiovascular Disease

## 2012-10-17 VITALS — BP 140/78 | HR 72 | Ht 61.0 in | Wt 149.4 lb

## 2012-10-17 DIAGNOSIS — I739 Peripheral vascular disease, unspecified: Secondary | ICD-10-CM

## 2012-10-17 DIAGNOSIS — I701 Atherosclerosis of renal artery: Secondary | ICD-10-CM

## 2012-10-17 NOTE — Assessment & Plan Note (Signed)
This is a chronic finding with small left renal artery and  kidney. No indication for revascularization.

## 2012-10-17 NOTE — Patient Instructions (Addendum)
Your physician has requested that you have an ankle brachial index (ABI) in [place]. During this test an ultrasound and blood pressure cuff are used to evaluate the arteries that supply the arms and legs with blood. Allow thirty minutes for this exam. There are no restrictions or special instructions.  Your physician has requested that you have an aorto-iliac duplex in [place]. During this test, an ultrasound is used to evaluate the aorta and iliac arteries. Do not eat after midnight the day before and avoid carbonated beverages  Your physician recommends that you continue on your current medications as directed. Please refer to the Current Medication list given to you today.  Your physician wants you to follow-up in: 1 YEAR with Dr Kirke Corin. You will receive a reminder letter in the mail two months in advance. If you don't receive a letter, please call our office to schedule the follow-up appointment.

## 2012-10-17 NOTE — Assessment & Plan Note (Signed)
She is doing well at this point with minimal claudication only at prolonged walking. Aortoiliac duplex showed moderate restenosis in the iliac stent. However, her ABI was within normal. ABI before the procedure was 0.59. I recommend continuing observation at this point. She is to notify me if symptoms worsen. I will arrange for a followup aortoiliac duplex and ABI in September.

## 2012-10-17 NOTE — Progress Notes (Signed)
HPI  This is a 72 year old pleasant female who is here today for a followup visit regarding claudication and peripheral arterial disease. The patient has known history of coronary artery disease status post CABG. She was seen in 09/2011 for severe discomfort mostly in the buttock and thigh area in the right side but not much on the left. Her right femoral artery pulse was absent with an ABI of 0.59. She underwent angiography in 10/2011 which showed 90% stenosis in the right common iliac artery. There was a 95% left renal artery stenosis which was described on previous angiography 2 years ago. The left renal artery appeared small with very small left kidney size. She underwent successful angioplasty and stent placement to the right common iliac artery without complications. She reports minimal discomfort involving the right leg only with extended walking. Overall she has been doing reasonably well.  Allergies  Allergen Reactions  . Sulfonamide Derivatives     REACTION: Rash     Current Outpatient Prescriptions on File Prior to Visit  Medication Sig Dispense Refill  . ALPRAZolam (XANAX) 0.25 MG tablet Take 0.25 mg by mouth at bedtime as needed. For sleep      . amLODipine (NORVASC) 5 MG tablet Take 5 mg by mouth daily.        Marland Kitchen aspirin 81 MG tablet Take 81 mg by mouth daily.        Marland Kitchen atorvastatin (LIPITOR) 20 MG tablet Take 20 mg by mouth daily.        . clopidogrel (PLAVIX) 75 MG tablet Take 75 mg by mouth daily.       Marland Kitchen docusate sodium (COLACE) 100 MG capsule Take 100 mg by mouth daily as needed. For constipations      . imipramine (TOFRANIL) 25 MG tablet Take 25 mg by mouth daily.        . isosorbide mononitrate (IMDUR) 60 MG 24 hr tablet Take 60 mg by mouth daily.       . metoprolol (TOPROL-XL) 50 MG 24 hr tablet Take 75 mg by mouth daily.        . nitroGLYCERIN (NITROSTAT) 0.4 MG SL tablet Place 0.4 mg under the tongue every 5 (five) minutes as needed. For chest pain      . Omega-3 Fatty  Acids (FISH OIL) 1000 MG CAPS Take 2 capsules by mouth 2 (two) times daily.         No current facility-administered medications on file prior to visit.     Past Medical History  Diagnosis Date  . Hypertension     Unspecified   . Hyperlipidemia, mixed   . S/P CABG (coronary artery bypass graft)   . CAD (coronary artery disease)     Cath 2008, [facility], 60% Left main  /   cath  06/02/2010..no change, 50% distal L Main, patent SVGs to OM and RCA  . Bradycardia     sinus, assymptomatic  . PAD (peripheral artery disease)     95% left renal artery stenosis and 90% right common  iliac stenosis. Status post stent placement to the right common iliac artery in July 2013 with an 8 x 29 mm stent  . COPD (chronic obstructive pulmonary disease)   . TIA (transient ischemic attack)     remote  . Hematoma     Readmitted 06/03/2010 post cath for L groin hematoma. ,, stabilized with no compression...home  . Ejection fraction     65%, cath, 05/2010  . Tachycardia  June, 2012  . Claudication     April, 2013  . Renal artery stenosis     95% stenosis  known from the past., Small left kidney, medical therapy  . Carotid artery disease     November, 2013  //   Doppler, March, 2014, stable, 0-39% bilateral, followup 2 years     No past surgical history on file.   Family History  Problem Relation Age of Onset  . Coronary artery disease       History   Social History  . Marital Status: Married    Spouse Name: N/A    Number of Children: N/A  . Years of Education: N/A   Occupational History  . Retired    Social History Main Topics  . Smoking status: Former Smoker -- 1.00 packs/day for 35 years    Types: Cigarettes    Quit date: 04/26/2000  . Smokeless tobacco: Never Used  . Alcohol Use: No  . Drug Use: Not on file  . Sexually Active: Not on file   Other Topics Concern  . Not on file   Social History Narrative  . No narrative on file     PHYSICAL EXAM   There were no  vitals taken for this visit.  Constitutional: She is oriented to person, place, and time. She appears well-developed and well-nourished. No distress.  HENT: No nasal discharge.  Head: Normocephalic and atraumatic.  Eyes: Pupils are equal and round. Right eye exhibits no discharge. Left eye exhibits no discharge.  Neck: Normal range of motion. Neck supple. No JVD present. No thyromegaly present.  Cardiovascular: Normal rate, regular rhythm, normal heart sounds. Exam reveals no gallop and no friction rub. No murmur heard.  Pulmonary/Chest: Effort normal and breath sounds normal. No stridor. No respiratory distress. She has no wheezes. She has no rales. She exhibits no tenderness.  Abdominal: Soft. Bowel sounds are normal. She exhibits no distension. There is no tenderness. There is no rebound and no guarding.  Musculoskeletal: Normal range of motion. She exhibits no edema and no tenderness.  Neurological: She is alert and oriented to person, place, and time. Coordination normal.  Skin: Skin is warm and dry. No rash noted. She is not diaphoretic. No erythema. No pallor.  Psychiatric: She has a normal mood and affect. Her behavior is normal. Judgment and thought content normal.  Femoral pulses are +1 bilaterally    ASSESSMENT AND PLAN

## 2012-11-15 ENCOUNTER — Encounter (HOSPITAL_COMMUNITY): Payer: Self-pay | Admitting: General Practice

## 2012-11-15 ENCOUNTER — Other Ambulatory Visit: Payer: Self-pay | Admitting: Physician Assistant

## 2012-11-15 ENCOUNTER — Inpatient Hospital Stay (HOSPITAL_COMMUNITY)
Admission: AD | Admit: 2012-11-15 | Discharge: 2012-11-16 | DRG: 247 | Disposition: A | Discharge status: Home / Self Care | Payer: Medicare Other | Source: Ambulatory Visit | Attending: Cardiovascular Disease | Admitting: Cardiovascular Disease

## 2012-11-15 ENCOUNTER — Encounter (HOSPITAL_COMMUNITY)
Admission: AD | Disposition: A | Discharge status: Home / Self Care | Payer: Self-pay | Source: Ambulatory Visit | Attending: Cardiovascular Disease

## 2012-11-15 ENCOUNTER — Inpatient Hospital Stay (HOSPITAL_COMMUNITY)
Admission: AD | Admit: 2012-11-15 | Payer: Self-pay | Source: Other Acute Inpatient Hospital | Admitting: Cardiovascular Disease

## 2012-11-15 DIAGNOSIS — I2510 Atherosclerotic heart disease of native coronary artery without angina pectoris: Secondary | ICD-10-CM | POA: Diagnosis present

## 2012-11-15 DIAGNOSIS — Z7982 Long term (current) use of aspirin: Secondary | ICD-10-CM

## 2012-11-15 DIAGNOSIS — E782 Mixed hyperlipidemia: Secondary | ICD-10-CM

## 2012-11-15 DIAGNOSIS — I10 Essential (primary) hypertension: Secondary | ICD-10-CM

## 2012-11-15 DIAGNOSIS — T82897A Other specified complication of cardiac prosthetic devices, implants and grafts, initial encounter: Secondary | ICD-10-CM | POA: Diagnosis present

## 2012-11-15 DIAGNOSIS — I25810 Atherosclerosis of coronary artery bypass graft(s) without angina pectoris: Secondary | ICD-10-CM | POA: Diagnosis present

## 2012-11-15 DIAGNOSIS — I200 Unstable angina: Secondary | ICD-10-CM

## 2012-11-15 DIAGNOSIS — Y831 Surgical operation with implant of artificial internal device as the cause of abnormal reaction of the patient, or of later complication, without mention of misadventure at the time of the procedure: Secondary | ICD-10-CM | POA: Diagnosis present

## 2012-11-15 DIAGNOSIS — I4510 Unspecified right bundle-branch block: Secondary | ICD-10-CM | POA: Diagnosis present

## 2012-11-15 DIAGNOSIS — I2582 Chronic total occlusion of coronary artery: Secondary | ICD-10-CM | POA: Diagnosis present

## 2012-11-15 DIAGNOSIS — Z79899 Other long term (current) drug therapy: Secondary | ICD-10-CM

## 2012-11-15 DIAGNOSIS — E785 Hyperlipidemia, unspecified: Secondary | ICD-10-CM | POA: Diagnosis present

## 2012-11-15 DIAGNOSIS — R079 Chest pain, unspecified: Secondary | ICD-10-CM

## 2012-11-15 DIAGNOSIS — Z882 Allergy status to sulfonamides status: Secondary | ICD-10-CM

## 2012-11-15 DIAGNOSIS — Z7902 Long term (current) use of antithrombotics/antiplatelets: Secondary | ICD-10-CM

## 2012-11-15 DIAGNOSIS — I214 Non-ST elevation (NSTEMI) myocardial infarction: Secondary | ICD-10-CM

## 2012-11-15 HISTORY — PX: PERCUTANEOUS CORONARY STENT INTERVENTION (PCI-S): SHX5485

## 2012-11-15 HISTORY — PX: LEFT HEART CATHETERIZATION WITH CORONARY ANGIOGRAM: SHX5451

## 2012-11-15 HISTORY — PX: GRAFT(S) ANGIOGRAM: SHX5479

## 2012-11-15 SURGERY — LEFT HEART CATHETERIZATION WITH CORONARY ANGIOGRAM
Anesthesia: LOCAL

## 2012-11-15 MED ORDER — CLOPIDOGREL BISULFATE 75 MG PO TABS
75.0000 mg | ORAL_TABLET | Freq: Every day | ORAL | Status: DC
  Administered 2012-11-16: 75 mg via ORAL
  Filled 2012-11-15: qty 1

## 2012-11-15 MED ORDER — CLOPIDOGREL BISULFATE 300 MG PO TABS
ORAL_TABLET | ORAL | Status: AC
  Filled 2012-11-15: qty 1

## 2012-11-15 MED ORDER — ALPRAZOLAM 0.25 MG PO TABS: 0.2500 mg | ORAL_TABLET | Freq: Every evening | ORAL | Status: DC | PRN

## 2012-11-15 MED ORDER — ISOSORBIDE MONONITRATE ER 60 MG PO TB24
60.0000 mg | ORAL_TABLET | Freq: Every day | ORAL | Status: DC
  Administered 2012-11-16: 10:00:00 60 mg via ORAL
  Filled 2012-11-15: qty 1

## 2012-11-15 MED ORDER — NITROGLYCERIN 0.2 MG/ML ON CALL CATH LAB
INTRAVENOUS | Status: AC
  Filled 2012-11-15: qty 1

## 2012-11-15 MED ORDER — SODIUM CHLORIDE 0.9 % IV SOLN: INTRAVENOUS | Status: DC

## 2012-11-15 MED ORDER — ONDANSETRON HCL 4 MG/2ML IJ SOLN: 4.0000 mg | Freq: Four times a day (QID) | INTRAMUSCULAR | Status: DC | PRN

## 2012-11-15 MED ORDER — ATORVASTATIN CALCIUM 20 MG PO TABS
20.0000 mg | ORAL_TABLET | Freq: Every day | ORAL | Status: DC
  Administered 2012-11-15: 19:00:00 20 mg via ORAL
  Filled 2012-11-15 (×2): qty 1

## 2012-11-15 MED ORDER — ASPIRIN 81 MG PO TABS: 81.0000 mg | ORAL_TABLET | Freq: Every day | ORAL | Status: DC

## 2012-11-15 MED ORDER — MIDAZOLAM HCL 2 MG/2ML IJ SOLN
INTRAMUSCULAR | Status: AC
  Filled 2012-11-15: qty 2

## 2012-11-15 MED ORDER — AMLODIPINE BESYLATE 5 MG PO TABS
5.0000 mg | ORAL_TABLET | Freq: Every day | ORAL | Status: DC
  Administered 2012-11-16: 10:00:00 5 mg via ORAL
  Filled 2012-11-15: qty 1

## 2012-11-15 MED ORDER — HEPARIN (PORCINE) IN NACL 2-0.9 UNIT/ML-% IJ SOLN
INTRAMUSCULAR | Status: AC
  Filled 2012-11-15: qty 1000

## 2012-11-15 MED ORDER — LIDOCAINE HCL (PF) 1 % IJ SOLN
INTRAMUSCULAR | Status: AC
  Filled 2012-11-15: qty 30

## 2012-11-15 MED ORDER — DOCUSATE SODIUM 100 MG PO CAPS
100.0000 mg | ORAL_CAPSULE | Freq: Every day | ORAL | Status: DC | PRN
  Filled 2012-11-15: qty 1

## 2012-11-15 MED ORDER — METOPROLOL SUCCINATE ER 50 MG PO TB24
75.0000 mg | ORAL_TABLET | Freq: Every day | ORAL | Status: DC
  Administered 2012-11-16: 10:00:00 75 mg via ORAL
  Filled 2012-11-15: qty 1

## 2012-11-15 MED ORDER — ASPIRIN EC 81 MG PO TBEC
81.0000 mg | DELAYED_RELEASE_TABLET | Freq: Every day | ORAL | Status: DC
  Administered 2012-11-16: 81 mg via ORAL
  Filled 2012-11-15 (×2): qty 1

## 2012-11-15 MED ORDER — IRBESARTAN 300 MG PO TABS
300.0000 mg | ORAL_TABLET | Freq: Every day | ORAL | Status: DC
  Administered 2012-11-16: 10:00:00 300 mg via ORAL
  Filled 2012-11-15: qty 1

## 2012-11-15 MED ORDER — FENTANYL CITRATE 0.05 MG/ML IJ SOLN
INTRAMUSCULAR | Status: AC
  Filled 2012-11-15: qty 2

## 2012-11-15 MED ORDER — IMIPRAMINE HCL 25 MG PO TABS
25.0000 mg | ORAL_TABLET | Freq: Every day | ORAL | Status: DC
  Administered 2012-11-16: 25 mg via ORAL
  Filled 2012-11-15: qty 1

## 2012-11-15 MED ORDER — ACETAMINOPHEN 325 MG PO TABS
650.0000 mg | ORAL_TABLET | ORAL | Status: DC | PRN
  Administered 2012-11-16: 650 mg via ORAL
  Filled 2012-11-15: qty 2

## 2012-11-15 MED ORDER — NITROGLYCERIN 0.4 MG SL SUBL: 0.4000 mg | SUBLINGUAL_TABLET | SUBLINGUAL | Status: DC | PRN

## 2012-11-15 MED ORDER — BIVALIRUDIN 250 MG IV SOLR
INTRAVENOUS | Status: AC
  Filled 2012-11-15: qty 250

## 2012-11-15 NOTE — H&P (Signed)
I reviewed the cardiology consult notes done today at [facility] as well as diagnostic testing. In brief, this is a 72 year old female who is well-known to me. She has known history of coronary artery disease status post remote CABG. Most recent cardiac catheterization in 2012 showed patent grafts. I performed right iliac artery stent placement in 2013 for severe claudication. Head primary cardiologist is Dr. Myrtis Ser. She started having chest discomfort about 5 days ago similar to her previous angina. She was seen by her primary care physician; Dr. Sherryll Burger who ordered a pharmacologic nuclear stress test. During the test today and after Lexiscan injection, she started having severe substernal chest discomfort. EKG showed significant anterior and anterolateral ST depression. The test could not be completed. She was transferred to the emergency room. She was found to have a troponin of 0.6. She is currently chest pain-free and referred for diagnostic cardiac catheterization and possible further intervention.

## 2012-11-15 NOTE — Interval H&P Note (Signed)
Cath Lab Visit (complete for each Cath Lab visit)  Clinical Evaluation Leading to the Procedure:   ACS: yes  Non-ACS:    Anginal Classification: CCS IV  Anti-ischemic medical therapy: Maximal Therapy (2 or more classes of medications)  Non-Invasive Test Results: No non-invasive testing performed  Prior CABG: Previous CABG      History and Physical Interval Note:  11/15/2012 2:55 PM  Linda [name]  has presented today for surgery, with the diagnosis of Chest pain  The various methods of treatment have been discussed with the patient and family. After consideration of risks, benefits and other options for treatment, the patient has consented to  Procedure(s): LEFT HEART CATHETERIZATION WITH CORONARY ANGIOGRAM (N/A) as a surgical intervention .  The patient's history has been reviewed, patient examined, no change in status, stable for surgery.  I have reviewed the patient's chart and labs.  Questions were answered to the patient's satisfaction.     Lorine Bears

## 2012-11-15 NOTE — CV Procedure (Signed)
Cardiac Catheterization Procedure Note  Name: Linda [name] MRN: [id number] DOB: Dec 15, 1940  Procedure: Left Heart Cath, Selective Coronary Angiography, LV angiography, SVG angiography, PTCA/Stent of SVG to left circumflex x2 in the proximal and ostial segment.  Indication: Non-ST elevation myocardial infarction.   Medications:  Sedation:  2 mg IV Versed, 50 mcg IV Fentanyl  Contrast:  105 ML Omnipaque  Diagnostic Procedure Details: The right groin was prepped, draped, and anesthetized with 1% lidocaine. Using the modified Seldinger technique, a 5 French sheath was introduced into the right femoral artery after using a micropuncture kit. A small hematoma was noted after placing the sheath which did not expand throughout the case. Standard Judkins catheters were used for selective coronary angiography and left ventriculography. An AR 1 catheter was used to engage the SVG to RCA. Catheter exchanges were performed over a wire.  The diagnostic procedure was well-tolerated without immediate complications.  Procedural Findings:  Hemodynamics: AO:  132/65   mmHg LV:  134/3    mmHg LVEDP: 12  mmHg  Coronary angiography: Coronary dominance: Right   Left Main:  Normal in size with 50% distal stenosis which is unchanged from most recent cardiac catheterization.  Left Anterior Descending (LAD):  Normal in size and moderately calcified. A stent is noted in the midsegment which is widely patent with only 10-20% in-stent restenosis. The rest of the LAD is free of significant disease.  Circumflex (LCx):  Normal in size and nondominant. The vessel is occluded in the midsegment.  Right Coronary Artery: Was not injected and known to be occluded.  SVG to OM: A stent is noted at the ostium with 70% in-stent restenosis. There is a 99% stenosis proximally after the stent with TIMI 1 flow.  SVG to RCA: Widely patent with no significant disease.   Left ventriculography: Left ventricular  systolic function is normal , LVEF is estimated at 55 %, there is no significant mitral regurgitation . There is moderate hypokinesis in the mid inferior wall  PCI Procedure Note:  Following the diagnostic procedure, the decision was made to proceed with PCI. The sheath was upsized to a 6 [place]. Weight-based bivalirudin was given for anticoagulation. Once a therapeutic ACT was achieved, a 6 French LCB guide catheter was inserted.  A intuition coronary guidewire was used to cross the lesion.  The lesion was predilated with a 2.5 x 12 mm balloon.  The lesion was then stented with a 2.75 x 15 Xience drug-eluting stent. The lesion in the ostium was direct stented with a 3.0 x 15 mm Xience drug-eluting stent which was postdilated with a 3.5 noncompliant balloon.  Following PCI, there was 0% residual stenosis and TIMI-3 flow in the proximal lesion and 20% residual stenosis in the ostial lesion. Final angiography confirmed an excellent result. Femoral hemostasis was achieved with a Mynx closure device.  I elected not to use distal protection device due to multiple stents and risk of entrapment. The patient tolerated the PCI procedure well. There were no immediate procedural complications.  The patient was transferred to the post catheterization recovery area for further monitoring.  PCI Data: Vessel - SVG to OM/Segment - proximal Percent Stenosis (pre)  99% TIMI-flow 1 Stent 2.75 x 15 mm Xience drug-eluting stent Percent Stenosis (post) 0% TIMI-flow (post) 3  Vessel - SVG to OM/Segment - ostial Percent Stenosis (pre)  70% TIMI-flow 3 Stent 3.0 x 15 mm Xience drug-eluting stent Percent Stenosis (post) 20% TIMI-flow (post) 3  Final Conclusions:  1.  Significant three-vessel coronary artery disease with patent stent in the LAD. Unchanged disease in distal left main. Patent SVG to RCA and subtotal occlusion of SVG to OM which is likely the culprit for non-ST elevation myocardial infarction. 2. Normal LV  systolic function and left ventricular end-diastolic pressure. 3. Successful angioplasty and drug-eluting stent placement to the SVG to OM.  Recommendations:  Continue lifelong dual antiplatelet therapy and aggressive medical therapy.  Lorine Bears MD, [facility] 11/15/2012, 4:27 PM

## 2012-11-16 DIAGNOSIS — I2510 Atherosclerotic heart disease of native coronary artery without angina pectoris: Secondary | ICD-10-CM

## 2012-11-16 DIAGNOSIS — I200 Unstable angina: Secondary | ICD-10-CM

## 2012-11-16 LAB — BASIC METABOLIC PANEL
CO2: 24 mEq/L (ref 19–32)
Calcium: 8.7 mg/dL (ref 8.4–10.5)
Creatinine, Ser: 0.86 mg/dL (ref 0.50–1.10)
Glucose, Bld: 112 mg/dL — ABNORMAL HIGH (ref 70–99)

## 2012-11-16 LAB — CBC
MCH: 30.1 pg (ref 26.0–34.0)
MCV: 93.3 fL (ref 78.0–100.0)
Platelets: 192 10*3/uL (ref 150–400)
RBC: 4.18 MIL/uL (ref 3.87–5.11)

## 2012-11-16 MED ORDER — HEART ATTACK BOUNCING BOOK
Freq: Once | Status: AC
Start: 2012-11-16 — End: 2012-11-16
  Administered 2012-11-16: 01:00:00
  Filled 2012-11-16: qty 1

## 2012-11-16 MED ORDER — CLOPIDOGREL BISULFATE 75 MG PO TABS: 75.0000 mg | ORAL_TABLET | Freq: Every day | ORAL | Status: DC

## 2012-11-16 MED ORDER — NITROGLYCERIN 0.4 MG SL SUBL: 0.4000 mg | SUBLINGUAL_TABLET | SUBLINGUAL | Status: AC | PRN

## 2012-11-16 MED FILL — Sodium Chloride IV Soln 0.9%: INTRAVENOUS | Qty: 50 | Status: AC

## 2012-11-16 NOTE — Progress Notes (Deleted)
Error

## 2012-11-16 NOTE — Progress Notes (Signed)
CARDIAC REHAB PHASE I   PRE:  Rate/Rhythm: 80 SR  BP:  Supine: 119/53  Sitting:   Standing:    SaO2: 100 2L 96 RA  MODE:  Ambulation: 500 ft   POST:  Rate/Rhythm: 116 ST  BP:  Supine:   Sitting: 104/75  Standing:    SaO2: 97 RA 0800-0905 Pt tolerated ambulation well without c/o of cp or SOB.VS stable. Pt did c/o of being tired after 500 foot walk. To reclinr after walk with call light in reach. Completed MI and stent education with pt. She voices understanding. Pt declines Outpt. CRP,not interested.  Melina Copa RN 11/16/2012 9:05 AM

## 2012-11-16 NOTE — Progress Notes (Signed)
Patient ID: Linda [name], female   DOB: 04-25-1941, 72 y.o.   MRN: [id number]   SUBJECTIVE: Patient has had increasing chest pain. During an exercise test yesterday she had more marked pain. She was sent to the emergency room at [facility] with a troponin of 0.6. She was sent here and stent was placed successfully. She is now quite stable.   Filed Vitals:   11/15/12 1900 11/15/12 2000 11/16/12 0001 11/16/12 0641  BP: 132/113 119/55 105/37 98/47  Pulse: 76 74 81 80  Temp:  97.6 F (36.4 C) 97.3 F (36.3 C) 97.4 F (36.3 C)  TempSrc:  Oral Oral Oral  Resp: 23 14 17 12   Weight:   145 lb 8.1 oz (66 kg)   SpO2: 95% 95% 95% 96%    Intake/Output Summary (Last 24 hours) at 11/16/12 0708 Last data filed at 11/16/12 0643  Gross per 24 hour  Intake    690 ml  Output    200 ml  Net    490 ml    LABS: Basic Metabolic Panel: No results found for this basename: NA, K, CL, CO2, GLUCOSE, BUN, CREATININE, CALCIUM, MG, PHOS,  in the last 72 hours Liver Function Tests: No results found for this basename: AST, ALT, ALKPHOS, BILITOT, PROT, ALBUMIN,  in the last 72 hours No results found for this basename: LIPASE, AMYLASE,  in the last 72 hours CBC: No results found for this basename: WBC, NEUTROABS, HGB, HCT, MCV, PLT,  in the last 72 hours Cardiac Enzymes: No results found for this basename: CKTOTAL, CKMB, CKMBINDEX, TROPONINI,  in the last 72 hours BNP: No components found with this basename: POCBNP,  D-Dimer: No results found for this basename: DDIMER,  in the last 72 hours Hemoglobin A1C: No results found for this basename: HGBA1C,  in the last 72 hours Fasting Lipid Panel: No results found for this basename: CHOL, HDL, LDLCALC, TRIG, CHOLHDL, LDLDIRECT,  in the last 72 hours Thyroid Function Tests: No results found for this basename: TSH, T4TOTAL, FREET3, T3FREE, THYROIDAB,  in the last 72 hours  RADIOLOGY: No results found.  PHYSICAL EXAM  patient is oriented to person  time and place. Affect is normal. The right groin cath site is stable. There is no hematoma. Lungs are clear. Respiratory effort is nonlabored. Cardiac exam reveals S1 and S2. There no clicks or significant murmurs.   TELEMETRY: I have reviewed telemetry today November 16, 2012. There is sinus rhythm.  ASSESSMENT AND PLAN:    CAD (coronary artery disease)   Unstable angina     Patient presented with very unstable angina. She did have slight troponin elevation related to this. She had a successful intervention yesterday. I've spoken with Dr. Kirke Corin. He and I both feel the patient is stable for discharge home today. Because she was just admitted yesterday however he later this morning with plans to go home later today. She will go home in the afternoon.   Willa Rough 11/16/2012 7:08 AM

## 2012-11-17 NOTE — Discharge Summary (Signed)
CARDIOLOGY DISCHARGE SUMMARY   Patient ID: Linda [name] MRN: [id number] DOB/AGE: 1940/10/29 72 y.o.  Admit date: 11/15/2012 Discharge date: 11/17/2012  Primary Discharge Diagnosis:     NSTEMI - PTCA/Stent of SVG to left circumflex x2 in the proximal and ostial segment  Secondary Discharge Diagnosis:    CAD (coronary artery disease)  Procedures: Left Heart Cath, Selective Coronary Angiography, LV angiography, SVG angiography, PTCA/Stent of SVG to left circumflex x2 in the proximal and ostial segment  Hospital Course: Linda [name] is a 72 y.o. female with a history of CAD. She had ongoing chest pain and ECG changes after Lexiscan administration for a stress test. Her troponin was elevated at [facility] indicating a NSTEMI. She was transferred to [facility] and taken to the cath lab.   Full results are below. She received DES x 2 to her SVG-OM. She tolerated the procedure well. She was continued on her home medications for hypertension and hyperlipidemia.   On July 24, [name] was seen by Dr Myrtis Ser. She was also seen by cardiac rehab. She was educated on MI restrictions, CRF reduction and increasing her activity. She was ambulating without chest pain or shortness of breath and considered stable for discharge, to follow up as an outpatient.  Labs:   Lab Results  Component Value Date   WBC 5.7 11/16/2012   HGB 12.6 11/16/2012   HCT 39.0 11/16/2012   MCV 93.3 11/16/2012   PLT 192 11/16/2012     Recent Labs Lab 11/16/12 0625  NA 137  K 4.6  CL 105  CO2 24  BUN 15  CREATININE 0.86  CALCIUM 8.7  GLUCOSE 112*   Cardiac Cath: 11/15/2012 Coronary dominance: Right  Left Main: Normal in size with 50% distal stenosis which is unchanged from most recent cardiac catheterization.  Left Anterior Descending (LAD): Normal in size and moderately calcified. A stent is noted in the midsegment which is widely patent with only 10-20% in-stent restenosis. The rest of the LAD is free of  significant disease.  Circumflex (LCx): Normal in size and nondominant. The vessel is occluded in the midsegment.  Right Coronary Artery: Was not injected and known to be occluded.  SVG to OM: A stent is noted at the ostium with 70% in-stent restenosis. There is a 99% stenosis proximally after the stent with TIMI 1 flow.  SVG to RCA: Widely patent with no significant disease. Left ventriculography: Left ventricular systolic function is normal , LVEF is estimated at 55 %, there is no significant mitral regurgitation . There is moderate hypokinesis in the mid inferior wall PCI Data:  Vessel - SVG to OM/Segment - proximal  Percent Stenosis (pre) 99%  TIMI-flow 1  Stent 2.75 x 15 mm Xience drug-eluting stent  Percent Stenosis (post) 0%  TIMI-flow (post) 3  Vessel - SVG to OM/Segment - ostial  Percent Stenosis (pre) 70%  TIMI-flow 3  Stent 3.0 x 15 mm Xience drug-eluting stent  Percent Stenosis (post) 20%  TIMI-flow (post) 3  Final Conclusions:  1. Significant three-vessel coronary artery disease with patent stent in the LAD. Unchanged disease in distal left main. Patent SVG to RCA and subtotal occlusion of SVG to OM which is likely the culprit for non-ST elevation myocardial infarction.  2. Normal LV systolic function and left ventricular end-diastolic pressure.  3. Successful angioplasty and drug-eluting stent placement to the SVG to OM.  Recommendations:  Continue lifelong dual antiplatelet therapy and aggressive medical therapy.  EKG:  16-Nov-2012 06:49:05  Normal sinus rhythm Incomplete right bundle branch block Right ventricular hypertrophy Nonspecific ST and T wave abnormality Since previous tracing 11/15/12 some improvement in ST depression, but significant ischemic changes persist (no chest pain) Vent. rate 79 BPM PR interval 206 ms QRS duration 92 ms QT/QTc 390/447 ms P-R-T axes 76 114 104   FOLLOW UP PLANS AND APPOINTMENTS Allergies  Allergen Reactions  . Sulfonamide  Derivatives     REACTION: Rash     Medication List         amLODipine 5 MG tablet  Commonly known as:  NORVASC  Take 5 mg by mouth daily.     aspirin 81 MG tablet  Take 81 mg by mouth daily.     atorvastatin 20 MG tablet  Commonly known as:  LIPITOR  Take 20 mg by mouth daily.     clopidogrel 75 MG tablet  Commonly known as:  PLAVIX  Take 1 tablet (75 mg total) by mouth daily.     docusate sodium 100 MG capsule  Commonly known as:  COLACE  Take 100 mg by mouth at bedtime.     doxylamine (Sleep) 25 MG tablet  Commonly known as:  UNISOM  Take 25 mg by mouth at bedtime as needed for sleep.     Fish Oil 1000 MG Caps  Take 2 capsules by mouth 2 (two) times daily.     imipramine 25 MG tablet  Commonly known as:  TOFRANIL  Take 25 mg by mouth daily.     isosorbide mononitrate 60 MG 24 hr tablet  Commonly known as:  IMDUR  Take 60 mg by mouth daily.     metoprolol succinate 50 MG 24 hr tablet  Commonly known as:  TOPROL-XL  Take 75 mg by mouth daily.     nitroGLYCERIN 0.4 MG SL tablet  Commonly known as:  NITROSTAT  Place 1 tablet (0.4 mg total) under the tongue every 5 (five) minutes as needed. For chest pain     olmesartan 40 MG tablet  Commonly known as:  BENICAR  Take 40 mg by mouth daily.        Discharge Orders   Future Appointments Provider Department Dept Phone   12/01/2012 2:40 PM Prescott Parma, PA-C [facility] [place] (near [place]) [phone]   12/27/2012 8:00 AM [facility] [place] (near [place]) [phone]   12/27/2012 9:00 AM [facility] [place] (near [place]) [phone]   Future Orders Complete By Expires     Diet - low sodium heart healthy  As directed     Increase activity slowly  As directed       Follow-up Information   Follow up with SERPE, EUGENE, PA-C On 12/01/2012. (at 2:40 pm)    Contact information:   [address] [place] [address] [phone]       BRING ALL  MEDICATIONS WITH YOU TO FOLLOW UP APPOINTMENTS  Time spent with patient to include physician time: 36 min Signed: Theodore Demark, PA-C 11/17/2012, 10:42 AM Co-Sign MD Patient seen and examined. I agree with the assessment and plan as detailed above. See also my additional thoughts below.   I saw the patient on rounds today and completed a complete progress note. I made the decision for her to go home. I agree with all the plans outlined.  Willa Rough, MD, [facility] 11/16/2012 10:46 AM

## 2012-12-01 ENCOUNTER — Encounter: Payer: Self-pay | Admitting: Physician Assistant

## 2012-12-01 ENCOUNTER — Ambulatory Visit (INDEPENDENT_AMBULATORY_CARE_PROVIDER_SITE_OTHER): Payer: Medicare Other | Admitting: Physician Assistant

## 2012-12-01 VITALS — BP 105/59 | HR 67 | Ht 61.0 in | Wt 144.4 lb

## 2012-12-01 DIAGNOSIS — I2510 Atherosclerotic heart disease of native coronary artery without angina pectoris: Secondary | ICD-10-CM

## 2012-12-01 DIAGNOSIS — E782 Mixed hyperlipidemia: Secondary | ICD-10-CM

## 2012-12-01 DIAGNOSIS — I739 Peripheral vascular disease, unspecified: Secondary | ICD-10-CM

## 2012-12-01 DIAGNOSIS — I10 Essential (primary) hypertension: Secondary | ICD-10-CM

## 2012-12-01 NOTE — Assessment & Plan Note (Signed)
Will reassess lipid status. Recommend aggressive treatment with target LDL 70 or less, if feasible.

## 2012-12-01 NOTE — Progress Notes (Addendum)
Primary Cardiologist: Jerral Bonito, MD   HPI: Post hospital followup from [facility], status post transfer from [facility] ED with NSTEMI.   She was originally referred for an outpatient Lexiscan stress Cardiolite study, per Dr. Sherryll Burger, for evaluation of recent development of intermittent CP. She completed the pharmacologic stress phase of the test, but began experiencing CP and was directed to the ED. Once deemed clinically stable, she was allowed to return to complete the final imaging phase of the study. Cardiac markers were also drawn. Once the initial troponin returned at 0.63, she was started on IV heparin and NTG, and arrangements were made for direct transfer to the cardiac catheterization lab. Of note, subsequent review of perfusion images indicated abnormal perfusion in the lateral, inferolateral, and apical lateral walls with stress, and with partial reversibility on rest; EF 76%. This was deemed a high risk study.   - Cardiac catheterization, July 23: Significant 3v CAD with patent LAD stent; unchanged distal LMCA disease; patent S.-RCA graft; subtotal occlusion of S.-OM graft, likely culprit vessel; EF 55%  Dr. Kirke Corin proceeded with successful PTCA/DES (2) of S.-OM graft, with recommendation for lifelong DAPT and aggressive medical therapy.  She returns today reporting no recurrent angina pectoris. She denies any complications of the R groin incision site, apart from minimal bruising, and states that this was the smoothest catheterization procedure she has ever experienced.  Allergies  Allergen Reactions  . Sulfonamide Derivatives     REACTION: Rash    Current Outpatient Prescriptions  Medication Sig Dispense Refill  . amLODipine (NORVASC) 5 MG tablet Take 5 mg by mouth daily.        Marland Kitchen aspirin 81 MG tablet Take 81 mg by mouth daily.        Marland Kitchen atorvastatin (LIPITOR) 20 MG tablet Take 20 mg by mouth daily.        . clopidogrel (PLAVIX) 75 MG tablet Take 1 tablet (75 mg total) by mouth daily.  30 tablet   11  . docusate sodium (COLACE) 100 MG capsule Take 100 mg by mouth at bedtime.      Marland Kitchen doxylamine, Sleep, (UNISOM) 25 MG tablet Take 25 mg by mouth at bedtime as needed for sleep.      . isosorbide mononitrate (IMDUR) 60 MG 24 hr tablet Take 60 mg by mouth daily.       . metoprolol (TOPROL-XL) 50 MG 24 hr tablet Take 75 mg by mouth daily.        . nitroGLYCERIN (NITROSTAT) 0.4 MG SL tablet Place 1 tablet (0.4 mg total) under the tongue every 5 (five) minutes as needed. For chest pain  25 tablet  12  . olmesartan (BENICAR) 40 MG tablet Take 40 mg by mouth daily.      . Omega-3 Fatty Acids (FISH OIL) 1000 MG CAPS Take 2 capsules by mouth 2 (two) times daily.         No current facility-administered medications for this visit.    Past Medical History  Diagnosis Date  . Hypertension     Unspecified   . Hyperlipidemia, mixed   . CAD (coronary artery disease)     Cath 2008, [place] atretic, 60% Left main  /   cath  06/02/2010..no change, 50% distal L Main, patent SVGs to OM and RCA  . Bradycardia     sinus, assymptomatic  . PAD (peripheral artery disease)     95% left renal artery stenosis and 90% right common  iliac stenosis. Status post stent placement to  the right common iliac artery in July 2013 with an 8 x 29 mm stent  . TIA (transient ischemic attack)     "2 or 3; last one was ~ 2002" (11/15/2012)  . Hematoma     Readmitted 06/03/2010 post cath for L groin hematoma. ,, stabilized with no compression...home  . Ejection fraction     65%, cath, 05/2010  . Tachycardia     June, 2012  . Claudication     April, 2013  . Renal artery stenosis     95% stenosis  known from the past., Small left kidney, medical therapy  . Carotid artery disease     November, 2013  //   Doppler, March, 2014, stable, 0-39% bilateral, followup 2 years  . Myocardial infarction 11/15/2012    "first one" (11/15/2012)  . Daily headache     Past Surgical History  Procedure Laterality Date  . Coronary artery bypass  graft  03/2001    "CABG X3" (11/15/2012)  . Cardiac catheterization  2012    "several in the last 12 years" (11/15/2012)  . Coronary angioplasty with stent placement      "counting the 2 they put in today I've got 6 or 7 stents total" (11/15/2012)  . Tonsillectomy and adenoidectomy  ~ 1948  . Knee surgery Left ~ 2004    "shattered" (11/15/2012)    History   Social History  . Marital Status: Married    Spouse Name: N/A    Number of Children: N/A  . Years of Education: N/A   Occupational History  . Retired    Social History Main Topics  . Smoking status: Former Smoker -- 1.00 packs/day for 35 years    Types: Cigarettes    Quit date: 03/30/2001  . Smokeless tobacco: Former Neurosurgeon    Types: Snuff  . Alcohol Use: No  . Drug Use: No  . Sexually Active: No   Other Topics Concern  . Not on file   Social History Narrative  . No narrative on file    Family History  Problem Relation Age of Onset  . Coronary artery disease      ROS: no nausea, vomiting; no fever, chills; no melena, hematochezia; no claudication  PHYSICAL EXAM: BP 105/59  Pulse 67  Ht 5\' 1"  (1.549 m)  Wt 144 lb 6.4 oz (65.499 kg)  BMI 27.3 kg/m2 GENERAL: 72 year old female; NAD HEENT: NCAT, PERRLA, EOMI; sclera clear; no xanthelasma NECK: palpable bilateral carotid pulses, no bruits; no JVD; no TM LUNGS: CTA bilaterally CARDIAC: RRR (S1, S2); no significant murmurs; no rubs or gallops ABDOMEN: soft, non-tender; intact BS EXTREMETIES: no significant peripheral edema SKIN: warm/dry; no obvious rash/lesions  MUSCULOSKELETAL: no joint deformity NEURO: no focal deficit; NL affect   EKG:    ASSESSMENT & PLAN:  CAD (coronary artery disease) Quiescent on current medication regimen, with no complaint of recurrent angina pectoris. Aggressive medical therapy and risk factor modification advised, including lifelong DAPT.  Hyperlipidemia, mixed Will reassess lipid status. Recommend aggressive treatment with  target LDL 70 or less, if feasible.  Hypertension Well-controlled on current medication regimen  PAD (peripheral artery disease) Follow up with Dr. Kirke Corin, as scheduled.    Gene Bran Aldridge, PAC

## 2012-12-01 NOTE — Assessment & Plan Note (Signed)
Followup with Dr. Arida, as scheduled 

## 2012-12-01 NOTE — Patient Instructions (Signed)
Continue all current medications.  Please have Dr. Sherryll Burger send [place] copy of cholesterol labs Follow up in  3 months

## 2012-12-01 NOTE — Assessment & Plan Note (Signed)
Quiescent on current medication regimen, with no complaint of recurrent angina pectoris. Aggressive medical therapy and risk factor modification advised, including lifelong DAPT.

## 2012-12-01 NOTE — Assessment & Plan Note (Signed)
Well-controlled on current medication regimen 

## 2012-12-27 ENCOUNTER — Encounter (INDEPENDENT_AMBULATORY_CARE_PROVIDER_SITE_OTHER): Payer: Medicare Other

## 2012-12-27 DIAGNOSIS — I739 Peripheral vascular disease, unspecified: Secondary | ICD-10-CM

## 2012-12-27 DIAGNOSIS — I700 Atherosclerosis of aorta: Secondary | ICD-10-CM

## 2012-12-29 ENCOUNTER — Encounter: Payer: Self-pay | Admitting: Cardiovascular Disease

## 2012-12-29 NOTE — Telephone Encounter (Signed)
This encounter was created in error - please disregard.

## 2012-12-29 NOTE — Telephone Encounter (Signed)
pt rtn call to lauren re test results

## 2013-01-16 ENCOUNTER — Ambulatory Visit (INDEPENDENT_AMBULATORY_CARE_PROVIDER_SITE_OTHER): Payer: Medicare Other | Admitting: Cardiovascular Disease

## 2013-01-16 ENCOUNTER — Encounter: Payer: Self-pay | Admitting: Cardiovascular Disease

## 2013-01-16 VITALS — BP 150/80 | HR 56 | Ht 61.0 in | Wt 145.0 lb

## 2013-01-16 DIAGNOSIS — I739 Peripheral vascular disease, unspecified: Secondary | ICD-10-CM

## 2013-01-16 DIAGNOSIS — E782 Mixed hyperlipidemia: Secondary | ICD-10-CM

## 2013-01-16 DIAGNOSIS — I2510 Atherosclerotic heart disease of native coronary artery without angina pectoris: Secondary | ICD-10-CM

## 2013-01-16 NOTE — Patient Instructions (Addendum)
Your physician has requested that you have an ankle brachial index (ABI). During this test an ultrasound and blood pressure cuff are used to evaluate the arteries that supply the arms and legs with blood. Allow thirty minutes for this exam. There are no restrictions or special instructions. To be done in 06/2013. To be done in [place].  Your physician has requested that you have an aorto lliac duplex. During this test, an ultrasound is used to evaluate the aorta. Allow 30 minutes for this exam. Do not eat after midnight the day before and avoid carbonated beverages.To be done in 06/2013. To be done in [place].  Your physician wants you to follow-up in: 6 months with Dr. Kirke Corin, 1 week after procedures. You will receive a reminder letter in the mail two months in advance. If you don't receive a letter, please call our office to schedule the follow-up appointment.  Your physician recommends that you continue on your current medications as directed. Please refer to the Current Medication list given to you today.

## 2013-01-16 NOTE — Assessment & Plan Note (Signed)
Continue treatment with atorvastatin. 

## 2013-01-16 NOTE — Progress Notes (Signed)
HPI  This is a 72 year old pleasant female who is here today for a followup visit regarding peripheral arterial disease. The patient has known history of coronary artery disease status post CABG. she presented in July of this year with non-ST elevation myocardial infarction. I performed cardiac catheterization which showed subtotal occlusion of SVG to OM in the proximal segment with ostial in-stent restenosis as well. PCI in 2 drug-eluting stents were placed in the proximal and ostial graft. She has done well since then with no recurrent chest pain.  She is known to have peripheral arterial disease and  seen in 09/2011 for severe discomfort mostly in the buttock and thigh area in the right side but not much on the left. Her right femoral artery pulse was absent with an ABI of 0.59. She underwent angiography in 10/2011 which showed 90% stenosis in the right common iliac artery. There was a 95% left renal artery stenosis which was described on previous angiography 2 years ago. The left renal artery appeared small with very small left kidney size. She underwent successful angioplasty and stent placement to the right common iliac artery without complications.  Duplex ultrasound that showed progressive restenosis since then. Velocity was greater than 400 on the right side in more than 300 in the left common iliac artery. However, ABI has remained normal. She denies claudication. She is very aware of her previous claudication symptoms and denies similar recurrent symptoms.    Allergies  Allergen Reactions  . Sulfonamide Derivatives     REACTION: Rash     Current Outpatient Prescriptions on File Prior to Visit  Medication Sig Dispense Refill  . amLODipine (NORVASC) 5 MG tablet Take 5 mg by mouth daily.        Marland Kitchen aspirin 81 MG tablet Take 81 mg by mouth daily.        . clopidogrel (PLAVIX) 75 MG tablet Take 1 tablet (75 mg total) by mouth daily.  30 tablet  11  . docusate sodium (COLACE) 100 MG capsule  Take 100 mg by mouth at bedtime.      Marland Kitchen doxylamine, Sleep, (UNISOM) 25 MG tablet Take 25 mg by mouth at bedtime as needed for sleep.      . isosorbide mononitrate (IMDUR) 60 MG 24 hr tablet Take 60 mg by mouth daily.       . metoprolol (TOPROL-XL) 50 MG 24 hr tablet Take 75 mg by mouth daily.        . nitroGLYCERIN (NITROSTAT) 0.4 MG SL tablet Place 1 tablet (0.4 mg total) under the tongue every 5 (five) minutes as needed. For chest pain  25 tablet  12  . olmesartan (BENICAR) 40 MG tablet Take 40 mg by mouth daily.      . Omega-3 Fatty Acids (FISH OIL) 1000 MG CAPS Take 2 capsules by mouth 2 (two) times daily.         No current facility-administered medications on file prior to visit.     Past Medical History  Diagnosis Date  . Hypertension     Unspecified   . Hyperlipidemia, mixed   . CAD (coronary artery disease)     Cath 2008, [place] atretic, 60% Left main  /   cath  06/02/2010..no change, 50% distal L Main, patent SVGs to OM and RCA  . Bradycardia     sinus, assymptomatic  . PAD (peripheral artery disease)     95% left renal artery stenosis and 90% right common  iliac stenosis. Status post stent placement  to the right common iliac artery in July 2013 with an 8 x 29 mm stent  . TIA (transient ischemic attack)     "2 or 3; last one was ~ 2002" (11/15/2012)  . Hematoma     Readmitted 06/03/2010 post cath for L groin hematoma. ,, stabilized with no compression...home  . Ejection fraction     65%, cath, 05/2010  . Tachycardia     June, 2012  . Claudication     April, 2013  . Renal artery stenosis     95% stenosis  known from the past., Small left kidney, medical therapy  . Carotid artery disease     November, 2013  //   Doppler, March, 2014, stable, 0-39% bilateral, followup 2 years  . Myocardial infarction 11/15/2012    "first one" (11/15/2012)  . Daily headache      Past Surgical History  Procedure Laterality Date  . Coronary artery bypass graft  03/2001    "CABG X3" (11/15/2012)   . Cardiac catheterization  2012    "several in the last 12 years" (11/15/2012)  . Coronary angioplasty with stent placement      "counting the 2 they put in today I've got 6 or 7 stents total" (11/15/2012)  . Tonsillectomy and adenoidectomy  ~ 1948  . Knee surgery Left ~ 2004    "shattered" (11/15/2012)     Family History  Problem Relation Age of Onset  . Coronary artery disease       History   Social History  . Marital Status: Married    Spouse Name: N/A    Number of Children: N/A  . Years of Education: N/A   Occupational History  . Retired    Social History Main Topics  . Smoking status: Former Smoker -- 1.00 packs/day for 35 years    Types: Cigarettes    Quit date: 03/30/2001  . Smokeless tobacco: Former Neurosurgeon    Types: Snuff  . Alcohol Use: No  . Drug Use: No  . Sexual Activity: No   Other Topics Concern  . Not on file   Social History Narrative  . No narrative on file     PHYSICAL EXAM   BP 150/80  Pulse 56  Ht 5\' 1"  (1.549 m)  Wt 145 lb (65.772 kg)  BMI 27.41 kg/m2  Constitutional: She is oriented to person, place, and time. She appears well-developed and well-nourished. No distress.  HENT: No nasal discharge.  Head: Normocephalic and atraumatic.  Eyes: Pupils are equal and round. Right eye exhibits no discharge. Left eye exhibits no discharge.  Neck: Normal range of motion. Neck supple. No JVD present. No thyromegaly present.  Cardiovascular: Normal rate, regular rhythm, normal heart sounds. Exam reveals no gallop and no friction rub. No murmur heard.  Pulmonary/Chest: Effort normal and breath sounds normal. No stridor. No respiratory distress. She has no wheezes. She has no rales. She exhibits no tenderness.  Abdominal: Soft. Bowel sounds are normal. She exhibits no distension. There is no tenderness. There is no rebound and no guarding.  Musculoskeletal: Normal range of motion. She exhibits no edema and no tenderness.  Neurological: She is alert  and oriented to person, place, and time. Coordination normal.  Skin: Skin is warm and dry. No rash noted. She is not diaphoretic. No erythema. No pallor.  Psychiatric: She has a normal mood and affect. Her behavior is normal. Judgment and thought content normal.  Femoral pulses are +1 bilaterally , posterior tibial is palpable bilaterally.  ASSESSMENT AND PLAN

## 2013-01-16 NOTE — Assessment & Plan Note (Signed)
No chest discomfort since stenting in July of this year. She is to stay on lifelong dual antiplatelet therapy.

## 2013-01-16 NOTE — Assessment & Plan Note (Signed)
In spite of progressive common iliac disease bilaterally noted by duplex, she has remained asymptomatic with normal ABI. I suspect that she will require bilateral common iliac kissing stenting in the near future. On the right side, the stenosis is most likely proximal to the previously placed stent. For now, we can follow her closely. Continue dual antiplatelet therapy. I will plan on repeat aortoiliac duplex and ABI in March. She knows to contact me if she develops recurrent claudication.

## 2013-03-01 ENCOUNTER — Other Ambulatory Visit: Payer: Self-pay

## 2013-03-10 ENCOUNTER — Encounter: Payer: Self-pay | Admitting: Cardiology

## 2013-03-12 ENCOUNTER — Encounter: Payer: Self-pay | Admitting: Cardiology

## 2013-03-12 ENCOUNTER — Ambulatory Visit (INDEPENDENT_AMBULATORY_CARE_PROVIDER_SITE_OTHER): Payer: Medicare Other | Admitting: Cardiology

## 2013-03-12 VITALS — BP 147/87 | HR 67 | Ht 61.0 in | Wt 145.1 lb

## 2013-03-12 DIAGNOSIS — R0989 Other specified symptoms and signs involving the circulatory and respiratory systems: Secondary | ICD-10-CM

## 2013-03-12 DIAGNOSIS — R600 Localized edema: Secondary | ICD-10-CM

## 2013-03-12 DIAGNOSIS — I10 Essential (primary) hypertension: Secondary | ICD-10-CM

## 2013-03-12 DIAGNOSIS — R609 Edema, unspecified: Secondary | ICD-10-CM

## 2013-03-12 DIAGNOSIS — I739 Peripheral vascular disease, unspecified: Secondary | ICD-10-CM

## 2013-03-12 DIAGNOSIS — I2510 Atherosclerotic heart disease of native coronary artery without angina pectoris: Secondary | ICD-10-CM

## 2013-03-12 DIAGNOSIS — R9430 Abnormal result of cardiovascular function study, unspecified: Secondary | ICD-10-CM

## 2013-03-12 NOTE — Assessment & Plan Note (Signed)
I believe her recent leg edema is from venous insufficiency. It occurs primarily when she is up or sitting for a long period of time. She's had an area of ecchymoses on her foot. I suspect she had a small leak with some bleeding in this area. It is stable at this time. I will check with Dr. Kirke Corin to be sure that it is safe for her to use support hose with her underlying arterial disease. At this time however she prefers to not use support hose anyway. If it is safe for her I want her to plan to use support hose at least when she travels.  As part of today's evaluation I spent greater than 25 minutes with her total care. More than half of this time has been with direct contact with her discussing all aspects of her edema and areas of ecchymoses in her other issues.

## 2013-03-12 NOTE — Assessment & Plan Note (Signed)
Blood pressure is controlled. No change in therapy. 

## 2013-03-12 NOTE — Assessment & Plan Note (Signed)
Her peripheral arterial disease is being followed very expertly by Dr. Kirke Corin. She has planned followup with him.

## 2013-03-12 NOTE — Assessment & Plan Note (Signed)
Her ejection fraction was 55% at the time of her catheterization in July, 2014.

## 2013-03-12 NOTE — Progress Notes (Signed)
HPI  The patient is seen today to followup coronary disease and peripheral vascular disease. I saw her last in the office in June, 2014. After that time she was seen in the office by another member of our team and a plan was made to proceed with an exercise test. She completed her pharmacologic phase, but then she began to have chest discomfort. She had continued discomfort and she was transferred to [facility]. Cardiac catheterization revealed subtotal occlusion of the vein graft to the OM. The ejection fraction was 55%. She received 2 drug-eluting stents to the vein graft to the OM. Recommendation is for lifelong dual antiplatelet therapy. The nuclear scan was completed and showed that she had ischemia in the inferolateral wall apical lateral wall and lateral wall. This of course was completely consistent with the findings done at the urgent catheterization.  After that time the patient was seen back in our office by another member of my team. I have reviewed that record. In addition the patient was then seen in followup by Dr. Kirke Corin to further assess her peripheral arterial disease. She has had an intervention before. She continues to feel well but her Doppler studies are abnormal. She will have very careful followup with Dr. Kirke Corin in March, 2015. If she develops any rest symptoms, she will be in touch with [place] right away.  Very recently the patient took her first plane ride. At the end of her trip she had significant edema. This did resolve over time and was not compatible with deep venous thrombosis. She then had intermittent edema with some discoloration of the skin in her left foot. This seems to become worse during the day and resolves at night. She's not having any chest pain or shortness of breath.  Allergies  Allergen Reactions  . Sulfonamide Derivatives     REACTION: Rash    Current Outpatient Prescriptions  Medication Sig Dispense Refill  . amLODipine (NORVASC) 5 MG tablet Take 5 mg  by mouth daily.        Marland Kitchen aspirin 81 MG tablet Take 81 mg by mouth daily.        Marland Kitchen atorvastatin (LIPITOR) 40 MG tablet Take 40 mg by mouth daily.      . clopidogrel (PLAVIX) 75 MG tablet Take 1 tablet (75 mg total) by mouth daily.  30 tablet  11  . docusate sodium (COLACE) 100 MG capsule Take 100 mg by mouth at bedtime.      Marland Kitchen doxylamine, Sleep, (UNISOM) 25 MG tablet Take 25 mg by mouth at bedtime as needed for sleep.      . isosorbide mononitrate (IMDUR) 60 MG 24 hr tablet Take 60 mg by mouth daily.       . metoprolol (TOPROL-XL) 50 MG 24 hr tablet Take 75 mg by mouth daily.        . nitroGLYCERIN (NITROSTAT) 0.4 MG SL tablet Place 1 tablet (0.4 mg total) under the tongue every 5 (five) minutes as needed. For chest pain  25 tablet  12  . olmesartan (BENICAR) 40 MG tablet Take 40 mg by mouth daily.      . Omega-3 Fatty Acids (FISH OIL) 1000 MG CAPS Take 2 capsules by mouth 2 (two) times daily.         No current facility-administered medications for this visit.    History   Social History  . Marital Status: Married    Spouse Name: N/A    Number of Children: N/A  .  Years of Education: N/A   Occupational History  . Retired    Social History Main Topics  . Smoking status: Former Smoker -- 1.00 packs/day for 35 years    Types: Cigarettes    Quit date: 03/30/2001  . Smokeless tobacco: Former Neurosurgeon    Types: Snuff  . Alcohol Use: No  . Drug Use: No  . Sexual Activity: No   Other Topics Concern  . Not on file   Social History Narrative  . No narrative on file    Family History  Problem Relation Age of Onset  . Coronary artery disease      Past Medical History  Diagnosis Date  . Hypertension     Unspecified   . Hyperlipidemia, mixed   . CAD (coronary artery disease)     Cath 2008, [place] atretic, 60% Left main  /   cath  06/02/2010..no change, 50% distal L Main, patent SVGs to OM and RCA  . Bradycardia     sinus, assymptomatic  . PAD (peripheral artery disease)     95%  left renal artery stenosis and 90% right common  iliac stenosis. Status post stent placement to the right common iliac artery in July 2013 with an 8 x 29 mm stent  . TIA (transient ischemic attack)     "2 or 3; last one was ~ 2002" (11/15/2012)  . Hematoma     Readmitted 06/03/2010 post cath for L groin hematoma. ,, stabilized with no compression...home  . Ejection fraction     65%, cath, 05/2010  . Tachycardia     June, 2012  . Claudication     April, 2013  . Renal artery stenosis     95% stenosis  known from the past., Small left kidney, medical therapy  . Carotid artery disease     November, 2013  //   Doppler, March, 2014, stable, 0-39% bilateral, followup 2 years  . Myocardial infarction 11/15/2012    "first one" (11/15/2012)  . Daily headache     Past Surgical History  Procedure Laterality Date  . Coronary artery bypass graft  03/2001    "CABG X3" (11/15/2012)  . Cardiac catheterization  2012    "several in the last 12 years" (11/15/2012)  . Coronary angioplasty with stent placement      "counting the 2 they put in today I've got 6 or 7 stents total" (11/15/2012)  . Tonsillectomy and adenoidectomy  ~ 1948  . Knee surgery Left ~ 2004    "shattered" (11/15/2012)    Patient Active Problem List   Diagnosis Date Noted  . Carotid artery disease   . PAD (peripheral artery disease)   . Renal artery stenosis   . Claudication   . Tachycardia   . Hypertension   . Hyperlipidemia, mixed   . S/P CABG (coronary artery bypass graft)   . CAD (coronary artery disease)   . Bradycardia   . COPD (chronic obstructive pulmonary disease)   . TIA (transient ischemic attack)   . Hematoma   . Ejection fraction     ROS   Patient denies fever, chills, headache, sweats, rash, change in vision, change in hearing, chest pain, cough, nausea vomiting, urinary symptoms. All other systems are reviewed and are negative.  PHYSICAL EXAM   Patient is stable. There is no jugulovenous distention. Lungs are  clear. Respiratory effort is nonlabored. Cardiac exam reveals S1 and S2. The abdomen is soft. There is trace peripheral edema. There is a small area of  ecchymoses on the top of her left foot.  Filed Vitals:   03/12/13 1309  BP: 147/87  Pulse: 67  Height: 5\' 1"  (1.549 m)  Weight: 145 lb 1.9 oz (65.826 kg)  SpO2: 92%     ASSESSMENT & PLAN

## 2013-03-12 NOTE — Assessment & Plan Note (Signed)
At this time her coronary disease is stable. She will remain on dual antiplatelet therapy in definitely. She is not having any symptoms.

## 2013-03-12 NOTE — Patient Instructions (Signed)

## 2013-03-13 ENCOUNTER — Ambulatory Visit: Payer: Medicare Other | Admitting: Cardiovascular Disease

## 2013-03-14 ENCOUNTER — Telehealth: Payer: Self-pay | Admitting: *Deleted

## 2013-03-14 NOTE — Telephone Encounter (Signed)
Patient informed and verbalized understanding of plan. 

## 2013-03-14 NOTE — Telephone Encounter (Signed)
Luis Abed, MD       Sent: Tue March 13, 2013 7:23 AM    To: Eustace Moore, LPN     Message     I have told the patient that I wanted her to wear her support hose if she were sitting or standing for a long time or traveling. But I told her I would check with Dr.Arida to be sure it was okay. It is okay for her to use them. Please let her know that I have checked and it is okay for her to use them.     ----- Message -----    From: Iran Ouch, MD    Sent: 03/12/2013 5:37 PM    To: Luis Abed, MD

## 2013-04-17 ENCOUNTER — Emergency Department (HOSPITAL_COMMUNITY): Payer: Medicare Other

## 2013-04-17 ENCOUNTER — Encounter (HOSPITAL_COMMUNITY): Payer: Self-pay | Admitting: Emergency Medicine

## 2013-04-17 ENCOUNTER — Ambulatory Visit: Payer: Medicare Other | Admitting: Cardiology

## 2013-04-17 ENCOUNTER — Telehealth: Payer: Self-pay | Admitting: *Deleted

## 2013-04-17 ENCOUNTER — Observation Stay (HOSPITAL_COMMUNITY)
Admission: EM | Admit: 2013-04-17 | Discharge: 2013-04-18 | Disposition: A | Discharge status: Home / Self Care | Payer: Medicare Other | Attending: Cardiology | Admitting: Cardiology

## 2013-04-17 DIAGNOSIS — Z8673 Personal history of transient ischemic attack (TIA), and cerebral infarction without residual deficits: Secondary | ICD-10-CM | POA: Insufficient documentation

## 2013-04-17 DIAGNOSIS — R079 Chest pain, unspecified: Principal | ICD-10-CM | POA: Insufficient documentation

## 2013-04-17 DIAGNOSIS — E782 Mixed hyperlipidemia: Secondary | ICD-10-CM | POA: Insufficient documentation

## 2013-04-17 DIAGNOSIS — R600 Localized edema: Secondary | ICD-10-CM

## 2013-04-17 DIAGNOSIS — I2510 Atherosclerotic heart disease of native coronary artery without angina pectoris: Secondary | ICD-10-CM | POA: Insufficient documentation

## 2013-04-17 DIAGNOSIS — J4489 Other specified chronic obstructive pulmonary disease: Secondary | ICD-10-CM | POA: Insufficient documentation

## 2013-04-17 DIAGNOSIS — I252 Old myocardial infarction: Secondary | ICD-10-CM | POA: Insufficient documentation

## 2013-04-17 DIAGNOSIS — Z951 Presence of aortocoronary bypass graft: Secondary | ICD-10-CM | POA: Insufficient documentation

## 2013-04-17 DIAGNOSIS — Z7982 Long term (current) use of aspirin: Secondary | ICD-10-CM | POA: Insufficient documentation

## 2013-04-17 DIAGNOSIS — Z9861 Coronary angioplasty status: Secondary | ICD-10-CM | POA: Insufficient documentation

## 2013-04-17 DIAGNOSIS — Z7902 Long term (current) use of antithrombotics/antiplatelets: Secondary | ICD-10-CM | POA: Insufficient documentation

## 2013-04-17 DIAGNOSIS — J449 Chronic obstructive pulmonary disease, unspecified: Secondary | ICD-10-CM | POA: Insufficient documentation

## 2013-04-17 DIAGNOSIS — I739 Peripheral vascular disease, unspecified: Secondary | ICD-10-CM | POA: Insufficient documentation

## 2013-04-17 DIAGNOSIS — Z87891 Personal history of nicotine dependence: Secondary | ICD-10-CM | POA: Insufficient documentation

## 2013-04-17 DIAGNOSIS — I10 Essential (primary) hypertension: Secondary | ICD-10-CM | POA: Insufficient documentation

## 2013-04-17 DIAGNOSIS — M79609 Pain in unspecified limb: Secondary | ICD-10-CM | POA: Insufficient documentation

## 2013-04-17 LAB — BASIC METABOLIC PANEL
BUN: 12 mg/dL (ref 6–23)
Calcium: 9.4 mg/dL (ref 8.4–10.5)
GFR calc Af Amer: >90 mL/min (ref >90)
GFR calc non Af Amer: 86 mL/min — ABNORMAL LOW (ref >90)
Glucose, Bld: 96 mg/dL (ref 70–99)
Potassium: 4.2 mEq/L (ref 3.5–5.1)
Sodium: 142 mEq/L (ref 135–145)

## 2013-04-17 LAB — CBC
Hemoglobin: 13.9 g/dL (ref 12.0–15.0)
MCH: 30.4 pg (ref 26.0–34.0)
MCHC: 33 g/dL (ref 30.0–36.0)
MCV: 92.1 fL (ref 78.0–100.0)
RBC: 4.57 MIL/uL (ref 3.87–5.11)

## 2013-04-17 LAB — PROTIME-INR
INR: 1.06 (ref 0.00–1.49)
Prothrombin Time: 13.6 seconds (ref 11.6–15.2)

## 2013-04-17 LAB — POCT I-STAT TROPONIN I: Troponin i, poc: 0 ng/mL (ref 0.00–0.08)

## 2013-04-17 MED ORDER — ASPIRIN 81 MG PO CHEW
324.0000 mg | CHEWABLE_TABLET | Freq: Once | ORAL | Status: AC
Start: 2013-04-17 — End: 2013-04-17
  Administered 2013-04-17: 324 mg via ORAL
  Filled 2013-04-17: qty 4

## 2013-04-17 MED ORDER — ASPIRIN EC 81 MG PO TBEC
81.0000 mg | DELAYED_RELEASE_TABLET | Freq: Every day | ORAL | Status: DC
  Filled 2013-04-17: qty 1

## 2013-04-17 MED ORDER — ASPIRIN 81 MG PO TABS: 81.0000 mg | ORAL_TABLET | Freq: Every day | ORAL | Status: DC

## 2013-04-17 MED ORDER — HEPARIN BOLUS VIA INFUSION
3000.0000 [IU] | Freq: Once | INTRAVENOUS | Status: AC
  Administered 2013-04-17: 3000 [IU] via INTRAVENOUS
  Filled 2013-04-17: qty 3000

## 2013-04-17 MED ORDER — SODIUM CHLORIDE 0.9 % IJ SOLN
3.0000 mL | Freq: Two times a day (BID) | INTRAMUSCULAR | Status: DC
  Administered 2013-04-17: 3 mL via INTRAVENOUS

## 2013-04-17 MED ORDER — NITROGLYCERIN 0.4 MG SL SUBL: 0.4000 mg | SUBLINGUAL_TABLET | SUBLINGUAL | Status: DC | PRN

## 2013-04-17 MED ORDER — ASPIRIN 81 MG PO CHEW
81.0000 mg | CHEWABLE_TABLET | ORAL | Status: AC
  Administered 2013-04-18: 81 mg via ORAL
  Filled 2013-04-17: qty 1

## 2013-04-17 MED ORDER — SODIUM CHLORIDE 0.9 % IJ SOLN: 3.0000 mL | INTRAMUSCULAR | Status: DC | PRN

## 2013-04-17 MED ORDER — IRBESARTAN 300 MG PO TABS
300.0000 mg | ORAL_TABLET | Freq: Every day | ORAL | Status: DC
  Filled 2013-04-17: qty 1

## 2013-04-17 MED ORDER — ONDANSETRON HCL 4 MG/2ML IJ SOLN: 4.0000 mg | Freq: Four times a day (QID) | INTRAMUSCULAR | Status: DC | PRN

## 2013-04-17 MED ORDER — ACETAMINOPHEN 325 MG PO TABS
650.0000 mg | ORAL_TABLET | ORAL | Status: DC | PRN
  Administered 2013-04-17: 650 mg via ORAL
  Filled 2013-04-17: qty 2

## 2013-04-17 MED ORDER — AMLODIPINE BESYLATE 5 MG PO TABS
5.0000 mg | ORAL_TABLET | Freq: Every day | ORAL | Status: DC
  Administered 2013-04-18: 5 mg via ORAL
  Filled 2013-04-17: qty 1

## 2013-04-17 MED ORDER — GABAPENTIN 100 MG PO CAPS
100.0000 mg | ORAL_CAPSULE | Freq: Every day | ORAL | Status: DC
  Administered 2013-04-17: 100 mg via ORAL
  Filled 2013-04-17 (×2): qty 1

## 2013-04-17 MED ORDER — SODIUM CHLORIDE 0.9 % IJ SOLN: 3.0000 mL | Freq: Two times a day (BID) | INTRAMUSCULAR | Status: DC

## 2013-04-17 MED ORDER — SODIUM CHLORIDE 0.9 % IV SOLN: 250.0000 mL | INTRAVENOUS | Status: DC | PRN

## 2013-04-17 MED ORDER — ATORVASTATIN CALCIUM 40 MG PO TABS
40.0000 mg | ORAL_TABLET | Freq: Every day | ORAL | Status: DC
  Administered 2013-04-17 – 2013-04-18 (×2): 40 mg via ORAL
  Filled 2013-04-17 (×2): qty 1

## 2013-04-17 MED ORDER — ZOLPIDEM TARTRATE 5 MG PO TABS: 5.0000 mg | ORAL_TABLET | Freq: Every evening | ORAL | Status: DC | PRN

## 2013-04-17 MED ORDER — DOCUSATE SODIUM 100 MG PO CAPS
200.0000 mg | ORAL_CAPSULE | Freq: Every day | ORAL | Status: DC
  Administered 2013-04-17: 200 mg via ORAL
  Filled 2013-04-17 (×2): qty 2

## 2013-04-17 MED ORDER — HEPARIN (PORCINE) IN NACL 100-0.45 UNIT/ML-% IJ SOLN
800.0000 [IU]/h | INTRAMUSCULAR | Status: DC
  Administered 2013-04-17: 800 [IU]/h via INTRAVENOUS
  Filled 2013-04-17: qty 250

## 2013-04-17 MED ORDER — NITROGLYCERIN 2 % TD OINT
1.0000 [in_us] | TOPICAL_OINTMENT | Freq: Once | TRANSDERMAL | Status: AC
  Administered 2013-04-17: 1 [in_us] via TOPICAL
  Filled 2013-04-17: qty 1

## 2013-04-17 MED ORDER — CLOPIDOGREL BISULFATE 75 MG PO TABS: 75.0000 mg | ORAL_TABLET | Freq: Every day | ORAL | Status: DC

## 2013-04-17 MED ORDER — METOPROLOL SUCCINATE ER 50 MG PO TB24
75.0000 mg | ORAL_TABLET | Freq: Every day | ORAL | Status: DC
  Administered 2013-04-18: 75 mg via ORAL
  Filled 2013-04-17: qty 1

## 2013-04-17 MED ORDER — ISOSORBIDE MONONITRATE ER 60 MG PO TB24
60.0000 mg | ORAL_TABLET | Freq: Every day | ORAL | Status: DC
  Administered 2013-04-18: 60 mg via ORAL
  Filled 2013-04-17: qty 1

## 2013-04-17 MED ORDER — SODIUM CHLORIDE 0.9 % IV SOLN
INTRAVENOUS | Status: DC
  Administered 2013-04-18: 04:00:00 via INTRAVENOUS

## 2013-04-17 NOTE — ED Provider Notes (Signed)
CSN: [id number]     Arrival date & time 04/17/13  1340 History   First MD Initiated Contact with Patient 04/17/13 1505     Chief Complaint  Patient presents with  . Chest Pain    Patient is a 72 y.o. female presenting with chest pain. The history is provided by the patient.  Chest Pain Pain location:  Substernal area, R chest and L chest Pain radiates to:  L arm Pain severity:  Mild Relieved by:  Nitroglycerin Associated symptoms: shortness of breath   Associated symptoms: no fever, no nausea and not vomiting   Risk factors: coronary artery disease    Symptoms started yesterday.  She had an episode with her chest burning for a couple hours.  It improved after taking NTG.  This morning it started again, but was mild.  She took a ntg and the symptoms resolved again.  Since then she has had another mild episode.  This has been occurring off and on for the last few weeks.  She has history of an MI this year.  She was having a stress test and experienced burning pain that is similar to what she experienced when today.  She has known history of CAD with stents and bypass.  She called Dr Myrtis Ser today and was told to come to the ED.  She has very mild burning right now , "not enough to pay attention to". Past Medical History  Diagnosis Date  . Hypertension     Unspecified   . Hyperlipidemia, mixed   . CAD (coronary artery disease)     a. s/p CABG x3v (2002); NSTEMI s/p PCI of the ostium of SVG to OM (2003); PCI with DES of SVG to OM (10/2012)   . Bradycardia   . PAD (peripheral artery disease)     a. s/p stent to right common iliac artery (2012)  . TIA (transient ischemic attack)   . Claudication   . Renal artery stenosis     95% stenosis  known from the past., Small left kidney, medical therapy  . Carotid artery disease     November, 2013  //   Doppler, March, 2014, stable, 0-39% bilateral, followup 2 years  . Myocardial infarction 11/15/2012  . Daily headache    Past Surgical History   Procedure Laterality Date  . Coronary artery bypass graft  03/2001    "CABG X3" (11/15/2012)  . Cardiac catheterization  2012    "several in the last 12 years" (11/15/2012)  . Coronary angioplasty with stent placement      "counting the 2 they put in today I've got 6 or 7 stents total" (11/15/2012)  . Tonsillectomy and adenoidectomy  ~ 1948  . Knee surgery Left ~ 2004    "shattered" (11/15/2012)   Family History  Problem Relation Age of Onset  . Coronary artery disease    . Heart attack Mother   . Heart failure Father   . Cancer Mother   . Heart failure Maternal Grandmother   . Diabetes Maternal Grandmother   . Kidney disease Paternal Grandfather    History  Substance Use Topics  . Smoking status: Former Smoker -- 1.00 packs/day for 35 years    Types: Cigarettes    Quit date: 03/30/2001  . Smokeless tobacco: Former Neurosurgeon    Types: Snuff  . Alcohol Use: No   OB History   Grav Para Term Preterm Abortions TAB SAB Ect Mult Living  Review of Systems  Constitutional: Negative for fever.  Respiratory: Positive for shortness of breath.   Cardiovascular: Positive for chest pain.  Gastrointestinal: Negative for nausea and vomiting.  All other systems reviewed and are negative.    Allergies  Sulfonamide derivatives  Home Medications   Current Outpatient Rx  Name  Route  Sig  Dispense  Refill  . amLODipine (NORVASC) 5 MG tablet   Oral   Take 5 mg by mouth daily.           Marland Kitchen aspirin 81 MG tablet   Oral   Take 81 mg by mouth daily.           Marland Kitchen atorvastatin (LIPITOR) 40 MG tablet   Oral   Take 40 mg by mouth daily.         . clopidogrel (PLAVIX) 75 MG tablet   Oral   Take 1 tablet (75 mg total) by mouth daily.   30 tablet   11   . docusate sodium (COLACE) 100 MG capsule   Oral   Take 200 mg by mouth at bedtime.          . gabapentin (NEURONTIN) 100 MG capsule   Oral   Take 100 mg by mouth at bedtime.         . isosorbide mononitrate  (IMDUR) 60 MG 24 hr tablet   Oral   Take 60 mg by mouth daily.          . metoprolol (TOPROL-XL) 50 MG 24 hr tablet   Oral   Take 75 mg by mouth daily.          . nitroGLYCERIN (NITROSTAT) 0.4 MG SL tablet   Sublingual   Place 1 tablet (0.4 mg total) under the tongue every 5 (five) minutes as needed. For chest pain   25 tablet   12   . olmesartan (BENICAR) 40 MG tablet   Oral   Take 40 mg by mouth daily.         . Omega-3 Fatty Acids (FISH OIL) 1000 MG CAPS   Oral   Take 2 capsules (2,000 mg total) by mouth 2 (two) times daily.          BP 129/61  Pulse 85  Temp(Src) 97.7 F (36.5 C) (Oral)  Resp 18  Ht 5\' 1"  (1.549 m)  Wt 141 lb 8.9 oz (64.21 kg)  BMI 26.76 kg/m2  SpO2 93% Physical Exam  Nursing note and vitals reviewed. Constitutional: She appears well-developed and well-nourished. No distress.  HENT:  Head: Normocephalic and atraumatic.  Right Ear: External ear normal.  Left Ear: External ear normal.  Eyes: Conjunctivae are normal. Right eye exhibits no discharge. Left eye exhibits no discharge. No scleral icterus.  Neck: Neck supple. No tracheal deviation present.  Cardiovascular: Normal rate, regular rhythm and intact distal pulses.   Pulmonary/Chest: Effort normal and breath sounds normal. No stridor. No respiratory distress. She has no wheezes. She has no rales.  Abdominal: Soft. Bowel sounds are normal. She exhibits no distension. There is no tenderness. There is no rebound and no guarding.  Musculoskeletal: She exhibits no edema and no tenderness.  Neurological: She is alert. She has normal strength. No sensory deficit. Cranial nerve deficit:  no gross defecits noted. She exhibits normal muscle tone. She displays no seizure activity. Coordination normal.  Skin: Skin is warm and dry. No rash noted.  Psychiatric: She has a normal mood and affect.    ED Course  Procedures (including  critical care time) Labs Review Labs Reviewed  BASIC METABOLIC  PANEL - Abnormal; Notable for the following:    GFR calc non Af Amer 86 (*)    All other components within normal limits  COMPREHENSIVE METABOLIC PANEL - Abnormal; Notable for the following:    Potassium 3.4 (*)    Glucose, Bld 106 (*)    Albumin 3.3 (*)    Total Bilirubin 0.2 (*)    GFR calc non Af Amer 66 (*)    GFR calc Af Amer 77 (*)    All other components within normal limits  LIPID PANEL - Abnormal; Notable for the following:    Triglycerides 162 (*)    All other components within normal limits  CBC  TROPONIN I  CBC  HEPARIN LEVEL (UNFRACTIONATED)  TROPONIN I  TROPONIN I  PROTIME-INR  TSH  PROTIME-INR  POCT I-STAT TROPONIN I  POCT I-STAT TROPONIN I   Imaging Review Dg Chest 2 View  04/17/2013   CLINICAL DATA:  Chest pain.  Shortness of breath.  EXAM: CHEST  2 VIEW  COMPARISON:  One-view chest 09/26/2004  FINDINGS: The heart is mildly enlarged. And no edema or effusion to suggest failure. Mild interstitial coarsening is stable. Patient is status post median sternotomy CABG. At least 1 of the grafts is stented. The visualized soft tissues and bony thorax are unremarkable.  IMPRESSION: 1. No acute cardiopulmonary disease. 2. Stable cardiomegaly and mild interstitial coarsening without evidence for failure.   Electronically Signed   By: Gennette Pac M.D.   On: 04/17/2013 14:48    EKG Interpretation    Date/Time:  Tuesday April 17 2013 13:44:39 EST Ventricular Rate:  70 PR Interval:  188 QRS Duration: 82 QT Interval:  430 QTC Calculation: 464 R Axis:   101 Text Interpretation:  Normal sinus rhythm Rightward axis Nonspecific ST and T wave abnormality Abnormal ECG No significant change since last tracing Confirmed by Coral Soler  MD-J, Adiel Mcnamara ([id number]) on 04/17/2013 3:13:21 PM             MDM   1. Chest pain    Consulted with [place] [facility].  Initial labs reassyuring however symptoms concerning for unstable angina.    Celene Kras, MD 04/18/13 [phone]

## 2013-04-17 NOTE — ED Notes (Signed)
Pt has significant cardiac history and is here with chest burning.  [place] cards

## 2013-04-17 NOTE — Telephone Encounter (Signed)
I called and sent her to the hospital

## 2013-04-17 NOTE — ED Notes (Signed)
Pt c/o intermittent chest burning X 3 weeks. sts she called her cardiologist to make an appointment and they told her to come straight to ED. Pt has hx of MI, several stents and a CABG. Pt reports the chest burning starts in center and radiates above bilateral breasts and then sometimes to her left arm. Nad, skin warm and dry, resp e/u. sts she did take a Nitro last night after an hour and a half of chest burning.

## 2013-04-17 NOTE — ED Notes (Signed)
Attempted to give but charge RN hasn't assigned room.

## 2013-04-17 NOTE — Progress Notes (Signed)
ANTICOAGULATION CONSULT NOTE - Initial Consult  Pharmacy Consult for heparin Indication: chest pain/ACS  Allergies  Allergen Reactions  . Sulfonamide Derivatives Rash and Other (See Comments)    Reaction: fever and rash    Patient Measurements:  Weight: 65.8 kg  Vital Signs: Temp: 97.7 F (36.5 C) (12/23 1348) Temp src: Oral (12/23 1348) BP: 156/74 mmHg (12/23 1600) Pulse Rate: 52 (12/23 1600)  Labs:  Recent Labs  04/17/13 1351  HGB 13.9  HCT 42.1  PLT 204  CREATININE 0.69    The CrCl is unknown because both a height and weight (above a minimum accepted value) are required for this calculation.   Medical History: Past Medical History  Diagnosis Date  . Hypertension     Unspecified   . Hyperlipidemia, mixed   . CAD (coronary artery disease)     a. s/p CABG x3v (2002); NSTEMI s/p PCI of the ostium of SVG to OM (2003); PCI with DES of SVG to OM (10/2012)   . Bradycardia   . PAD (peripheral artery disease)     a. s/p stent to right common iliac artery (2012)  . TIA (transient ischemic attack)   . Claudication   . Renal artery stenosis     95% stenosis  known from the past., Small left kidney, medical therapy  . Carotid artery disease     November, 2013  //   Doppler, March, 2014, stable, 0-39% bilateral, followup 2 years  . Myocardial infarction 11/15/2012  . Daily headache     Medications:  No blood thinners  Assessment: 72 yo F to start heparin per pharmacy for ACS/STEMI.  Wt 65.8 kg, creat 0.69, Hg 12.9, plct 204, not on anticoagulants PTA but on DAPT after her last PCI with DESs.   PMH significant for CAD, stents, CABG.  Sent to the ED by Dr. Myrtis Ser.   First troponin is negative.  EKG with no acute changes.  Plan for cath tomorrow.   Goal of Therapy:  Heparin level 0.3-0.7 units/ml Monitor platelets by anticoagulation protocol: Yes   Plan:  Give 3000 units bolus x 1 Start heparin infusion at 800 units/hr Check anti-Xa level in 8 hours and daily while  on heparin Continue to monitor H&H and platelets  Herby Abraham, Pharm.D. [phone] 04/17/2013 4:52 PM

## 2013-04-17 NOTE — ED Notes (Signed)
Cardiology at bedside.

## 2013-04-17 NOTE — Telephone Encounter (Signed)
Patient called with c/o burning pain in her left arm and in her chest that started last night rated 6 on a scale of 1-10 (10 being the greatest). Patient took nitro last night with relief. Patient states she woke up again this morning with the burning sensation that's steady. Also, c/o DOE. Nurse advised patient to take her nitro now to see if she gets relief. Patient given appointment to come in office today @1 :15 pm. Nurse advised patient that if her symptoms get worse, she can go to ED for evaluation.

## 2013-04-17 NOTE — H&P (Signed)
Patient ID: Linda [name] MRN: [id number], DOB/AGE: [birth year]   Admit date: 04/17/2013   Primary Physician: Kirstie Peri, MD Primary Cardiologist: Dr. Myrtis Ser Dr. Kirke Corin for PVD  Pt. Profile:  Linda [name] is a 72 y.o. female with a history of CAD s/p CABG x3v (2002); s/p PCI of the ostium of SVG to OM (2003); NSTEMI s/p PCI with DESx 2 of SVG to OM (10/2012) , PVD s/p stent to right common iliac artery (2012), previous TIAs, HTN, and HLD who presented to the ED today with burning chest pain. Last night she started to notice a 8/10 burning sensation accross her chest that radiated to her left arm at points.It woke her up from sleep. NTG relieved it completely in about 10 minutes. Reports associated SOB and a little lightheadedness. No palpitations, diaphoresis, or n/v. She woke up this morning and the same burning sensation returned. It was made worse with exertion. Her pain is reminisent of her previous cardiac pain. She called Dr. Myrtis Ser who directed her to the ED. She was given aspirin in the ED and has a NTG patch on currently. She currently has 2/10 "discomfort." She is currently on DAPT after her last PCI with DES's and is compliant with all of her medications.   Problem List  Past Medical History  Diagnosis Date  . Hypertension     Unspecified   . Hyperlipidemia, mixed   . CAD (coronary artery disease)     a. s/p CABG x3v (2002); NSTEMI s/p PCI of the ostium of SVG to OM (2003); PCI with DES of SVG to OM (10/2012)   . Bradycardia   . PAD (peripheral artery disease)     a. s/p stent to right common iliac artery (2012)  . TIA (transient ischemic attack)   . Claudication   . Renal artery stenosis     95% stenosis  known from the past., Small left kidney, medical therapy  . Carotid artery disease     November, 2013  //   Doppler, March, 2014, stable, 0-39% bilateral, followup 2 years  . Myocardial infarction 11/15/2012  . Daily headache     Past Surgical History    Procedure Laterality Date  . Coronary artery bypass graft  03/2001    "CABG X3" (11/15/2012)  . Cardiac catheterization  2012    "several in the last 12 years" (11/15/2012)  . Coronary angioplasty with stent placement      "counting the 2 they put in today I've got 6 or 7 stents total" (11/15/2012)  . Tonsillectomy and adenoidectomy  ~ 1948  . Knee surgery Left ~ 2004    "shattered" (11/15/2012)     Allergies  Allergies  Allergen Reactions  . Sulfonamide Derivatives Rash and Other (See Comments)    Reaction: fever and rash    Home Medications  Prior to Admission medications   Medication Sig Start Date End Date Taking? Authorizing Provider  amLODipine (NORVASC) 5 MG tablet Take 5 mg by mouth daily.      Historical Provider, MD  aspirin 81 MG tablet Take 81 mg by mouth daily.      Historical Provider, MD  atorvastatin (LIPITOR) 40 MG tablet Take 40 mg by mouth daily.    Historical Provider, MD  clopidogrel (PLAVIX) 75 MG tablet Take 1 tablet (75 mg total) by mouth daily. 11/16/12   Rhonda G Barrett, PA-C  docusate sodium (COLACE) 100 MG capsule Take 100 mg by mouth at bedtime.    Historical  Provider, MD  doxylamine, Sleep, (UNISOM) 25 MG tablet Take 25 mg by mouth at bedtime as needed for sleep.    Historical Provider, MD  isosorbide mononitrate (IMDUR) 60 MG 24 hr tablet Take 60 mg by mouth daily.     Historical Provider, MD  metoprolol (TOPROL-XL) 50 MG 24 hr tablet Take 75 mg by mouth daily.      Historical Provider, MD  nitroGLYCERIN (NITROSTAT) 0.4 MG SL tablet Place 1 tablet (0.4 mg total) under the tongue every 5 (five) minutes as needed. For chest pain 11/16/12   Joline Salt Barrett, PA-C  olmesartan (BENICAR) 40 MG tablet Take 40 mg by mouth daily.    Historical Provider, MD  Omega-3 Fatty Acids (FISH OIL) 1000 MG CAPS Take 2 capsules by mouth 2 (two) times daily.      Historical Provider, MD    Family History  Family History  Problem Relation Age of Onset  . Coronary artery  disease    . Heart attack Mother   . Heart failure Father   . Cancer Mother   . Heart failure Maternal Grandmother   . Diabetes Maternal Grandmother   . Kidney disease Paternal Grandfather     Social History  History   Social History  . Marital Status: Married    Spouse Name: N/A    Number of Children: N/A  . Years of Education: N/A   Occupational History  . Retired    Social History Main Topics  . Smoking status: Former Smoker -- 1.00 packs/day for 35 years    Types: Cigarettes    Quit date: 03/30/2001  . Smokeless tobacco: Former Neurosurgeon    Types: Snuff  . Alcohol Use: No  . Drug Use: No  . Sexual Activity: No   Other Topics Concern  . Not on file   Social History Narrative   She lives is [place] with husband and has two biological children.       All other systems reviewed and are otherwise negative except as noted above.   Physical Exam  Blood pressure 156/74, pulse 52, temperature 97.7 F (36.5 C), temperature source Oral, resp. rate 15, SpO2 96.00%.  General: NAD, pleasant Head: Normocephalic and atraumatic.  Eyes: Pupils are equal and round.  Neck: Normal range of motion. Neck supple. No JVD present. No thyromegaly present.  Cardiovascular: Normal rate, regular rhythm, normal heart sounds. Exam reveals no gallop and no friction rub. No murmur heard.  Pulmonary/Chest: Effort normal and breath sounds normal. No stridor. No respiratory distress. She has no wheezes. She has no rales. She exhibits no tenderness.  Abdominal: Soft. Bowel sounds are normal. She exhibits no distension or tenderness Neurological: She is alert and oriented to person, place, and time. Skin: Skin is warm and dry. No rash noted. She is not diaphoretic. No erythema. No pallor.  Psychiatric: She has a normal mood and affect. Her behavior is normal. Judgment and thought content normal.  Femoral pulses are +1 bilaterally , posterior tibial is palpable bilaterally.   Labs  No results  found for this basename: CKTOTAL, CKMB, TROPONINI,  in the last 72 hours Lab Results  Component Value Date   WBC 5.8 04/17/2013   HGB 13.9 04/17/2013   HCT 42.1 04/17/2013   MCV 92.1 04/17/2013   PLT 204 04/17/2013     Recent Labs Lab 04/17/13 1351  NA 142  K 4.2  CL 104  CO2 28  BUN 12  CREATININE 0.69  CALCIUM 9.4  GLUCOSE  96     Radiology/Studies  Cardiac Catheterization Procedure Note  11/15/12 Indication: Non-ST elevation myocardial infarction.  Medications:  Sedation: 2 mg IV Versed, 50 mcg IV Fentanyl  Contrast: 105 ML Omnipaque  Diagnostic Procedure Details: The right groin was prepped, draped, and anesthetized with 1% lidocaine. Using the modified Seldinger technique, a 5 French sheath was introduced into the right femoral artery after using a micropuncture kit. A small hematoma was noted after placing the sheath which did not expand throughout the case. Standard Judkins catheters were used for selective coronary angiography and left ventriculography. An AR 1 catheter was used to engage the SVG to RCA. Catheter exchanges were performed over a wire. The diagnostic procedure was well-tolerated without immediate complications.  Procedural Findings:  Hemodynamics:  AO: 132/65 mmHg  LV: 134/3 mmHg  LVEDP: 12 mmHg  Coronary angiography:  Coronary dominance: Right  Left Main: Normal in size with 50% distal stenosis which is unchanged from most recent cardiac catheterization.  Left Anterior Descending (LAD): Normal in size and moderately calcified. A stent is noted in the midsegment which is widely patent with only 10-20% in-stent restenosis. The rest of the LAD is free of significant disease.  Circumflex (LCx): Normal in size and nondominant. The vessel is occluded in the midsegment.  Right Coronary Artery: Was not injected and known to be occluded.  SVG to OM: A stent is noted at the ostium with 70% in-stent restenosis. There is a 99% stenosis proximally after the stent  with TIMI 1 flow.  SVG to RCA: Widely patent with no significant disease. Left ventriculography: Left ventricular systolic function is normal , LVEF is estimated at 55 %, there is no significant mitral regurgitation . There is moderate hypokinesis in the mid inferior wall  PCI Procedure Note: Following the diagnostic procedure, the decision was made to proceed with PCI. The sheath was upsized to a 6 [place]. Weight-based bivalirudin was given for anticoagulation. Once a therapeutic ACT was achieved, a 6 French LCB guide catheter was inserted. A intuition coronary guidewire was used to cross the lesion. The lesion was predilated with a 2.5 x 12 mm balloon. The lesion was then stented with a 2.75 x 15 Xience drug-eluting stent. The lesion in the ostium was direct stented with a 3.0 x 15 mm Xience drug-eluting stent which was postdilated with a 3.5 noncompliant balloon. Following PCI, there was 0% residual stenosis and TIMI-3 flow in the proximal lesion and 20% residual stenosis in the ostial lesion. Final angiography confirmed an excellent result. Femoral hemostasis was achieved with a Mynx closure device. I elected not to use distal protection device due to multiple stents and risk of entrapment. The patient tolerated the PCI procedure well. There were no immediate procedural complications. The patient was transferred to the post catheterization recovery area for further monitoring.  PCI Data:  Vessel - SVG to OM/Segment - proximal  Percent Stenosis (pre) 99%  TIMI-flow 1  Stent 2.75 x 15 mm Xience drug-eluting stent  Percent Stenosis (post) 0%  TIMI-flow (post) 3  Vessel - SVG to OM/Segment - ostial  Percent Stenosis (pre) 70%  TIMI-flow 3  Stent 3.0 x 15 mm Xience drug-eluting stent  Percent Stenosis (post) 20%  TIMI-flow (post) 3  Final Conclusions:  1. Significant three-vessel coronary artery disease with patent stent in the LAD. Unchanged disease in distal left main. Patent SVG to RCA and  subtotal occlusion of SVG to OM which is likely the culprit for non-ST elevation myocardial infarction.  2. Normal LV systolic function and left ventricular end-diastolic pressure.  3. Successful angioplasty and drug-eluting stent placement to the SVG to OM.  Recommendations:   Dg Chest 2 View  04/17/2013   CLINICAL DATA:  Chest pain.  Shortness of breath.  EXAM: CHEST  2 VIEW  COMPARISON:  One-view chest 09/26/2004  FINDINGS: The heart is mildly enlarged. And no edema or effusion to suggest failure. Mild interstitial coarsening is stable. Patient is status post median sternotomy CABG. At least 1 of the grafts is stented. The visualized soft tissues and bony thorax are unremarkable.  IMPRESSION: 1. No acute cardiopulmonary disease. 2. Stable cardiomegaly and mild interstitial coarsening without evidence for failure.    ECG HR 70 NSR with RAD, non specific ST and TW abnormality.   ASSESSMENT AND PLAN  Manahil Vanzile is a 72 y.o. female with a history of CAD s/p CABG x3v (2002); NSTEMI s/p PCI of the ostium of SVG to OM (2003); NSTEMI s/p PCI with DESx 2 of SVG to OM (10/2012) , PVD s/p stent to right common iliac artery (2012), HTN, and HLD who presented to the ED today with burning chest pain.  Chest pain Patient with known coronary artery disease with chest pain reminiscent of previous cardiac chest pain s/p CABG and multiple stents.  -- POC troponin negative, EKG with no acute ST or TW changes -- Monitor on telemetry, cycle cardiac enzymes with serial EKGs -- Continue NTG patch, chest pain free now -- Continue ASA/Plavix, BB, Statin, ARB -- Plan cath for tomorrow, NPO after midnight  -- IV heparin per pharmacy   HTN-- Cont amlodipine, continue olmesartan   HLD -- Cont statin  PVD- Stable, followed by Dr. Kirke Corin, continue statin -- She will require bilateral common iliac kissing stenting in the near future per Dr. Kirke Corin  Leg pain- Continue gabapentin   Signed, Thereasa Parkin,  PA-C 04/17/2013, 4:07 PM  Pager [phone] The patient was seen in the emergency room with Thereasa Parkin [facility].  I agree with her assessment and plan.  The patient states that her chest discomfort which occurred during the night and again this morning was very similar to her cardiac pain which she has experienced in the past prior to PCI procedures.  Her electrocardiogram today is nonacute and shows nonspecific ST-T wave changes.  Her initial troponin is negative.  Her physical examination shows no acute distress.  She is pain free at the present time.  Her lungs are clear.  The heart reveals no murmur gallop rub or click.  The abdomen is nontender.  The femoral pulses are weak but present.  The left dorsalis pedis pulse is stronger than the right.  There is no phlebitis or edema.  Will plan for left heart catheterization in a.m.

## 2013-04-18 ENCOUNTER — Encounter (HOSPITAL_COMMUNITY)
Admission: EM | Disposition: A | Discharge status: Home / Self Care | Payer: Self-pay | Source: Home / Self Care | Attending: Emergency Medicine

## 2013-04-18 DIAGNOSIS — I2510 Atherosclerotic heart disease of native coronary artery without angina pectoris: Secondary | ICD-10-CM

## 2013-04-18 DIAGNOSIS — R609 Edema, unspecified: Secondary | ICD-10-CM

## 2013-04-18 HISTORY — PX: LEFT HEART CATHETERIZATION WITH CORONARY ANGIOGRAM: SHX5451

## 2013-04-18 LAB — COMPREHENSIVE METABOLIC PANEL
ALT: 17 U/L (ref 0–35)
AST: 18 U/L (ref 0–37)
Albumin: 3.3 g/dL — ABNORMAL LOW (ref 3.5–5.2)
Alkaline Phosphatase: 40 U/L (ref 39–117)
BUN: 19 mg/dL (ref 6–23)
Chloride: 102 mEq/L (ref 96–112)
Potassium: 3.4 mEq/L — ABNORMAL LOW (ref 3.5–5.1)
Sodium: 140 mEq/L (ref 135–145)
Total Bilirubin: 0.2 mg/dL — ABNORMAL LOW (ref 0.3–1.2)
Total Protein: 6.4 g/dL (ref 6.0–8.3)

## 2013-04-18 LAB — CBC
MCH: 30.3 pg (ref 26.0–34.0)
MCHC: 33.5 g/dL (ref 30.0–36.0)
MCV: 90.5 fL (ref 78.0–100.0)
Platelets: 195 10*3/uL (ref 150–400)
RBC: 4.22 MIL/uL (ref 3.87–5.11)
RDW: 13.4 % (ref 11.5–15.5)

## 2013-04-18 LAB — LIPID PANEL
Cholesterol: 129 mg/dL (ref 0–200)
HDL: 42 mg/dL (ref >39)
LDL Cholesterol: 55 mg/dL (ref 0–99)
Total CHOL/HDL Ratio: 3.1 RATIO
Triglycerides: 162 mg/dL — ABNORMAL HIGH (ref <150)
VLDL: 32 mg/dL (ref 0–40)

## 2013-04-18 LAB — HEPARIN LEVEL (UNFRACTIONATED): Heparin Unfractionated: 0.38 IU/mL (ref 0.30–0.70)

## 2013-04-18 LAB — POCT ACTIVATED CLOTTING TIME: Activated Clotting Time: 127 seconds

## 2013-04-18 SURGERY — LEFT HEART CATHETERIZATION WITH CORONARY ANGIOGRAM
Anesthesia: LOCAL

## 2013-04-18 MED ORDER — FENTANYL CITRATE 0.05 MG/ML IJ SOLN
INTRAMUSCULAR | Status: AC
  Filled 2013-04-18: qty 2

## 2013-04-18 MED ORDER — NITROGLYCERIN 0.2 MG/ML ON CALL CATH LAB
INTRAVENOUS | Status: AC
  Filled 2013-04-18: qty 1

## 2013-04-18 MED ORDER — LIDOCAINE HCL (PF) 1 % IJ SOLN
INTRAMUSCULAR | Status: AC
  Filled 2013-04-18: qty 30

## 2013-04-18 MED ORDER — ONDANSETRON HCL 4 MG/2ML IJ SOLN: 4.0000 mg | Freq: Four times a day (QID) | INTRAMUSCULAR | Status: DC | PRN

## 2013-04-18 MED ORDER — HEPARIN (PORCINE) IN NACL 2-0.9 UNIT/ML-% IJ SOLN
INTRAMUSCULAR | Status: AC
  Filled 2013-04-18: qty 1500

## 2013-04-18 MED ORDER — POTASSIUM CHLORIDE CRYS ER 20 MEQ PO TBCR
40.0000 meq | EXTENDED_RELEASE_TABLET | Freq: Once | ORAL | Status: AC
  Administered 2013-04-18: 40 meq via ORAL
  Filled 2013-04-18: qty 2

## 2013-04-18 MED ORDER — SODIUM CHLORIDE 0.9 % IV SOLN: INTRAVENOUS | Status: DC

## 2013-04-18 MED ORDER — FISH OIL 1000 MG PO CAPS: 2.0000 | ORAL_CAPSULE | Freq: Two times a day (BID) | ORAL | Status: AC

## 2013-04-18 MED ORDER — ACETAMINOPHEN 325 MG PO TABS: 650.0000 mg | ORAL_TABLET | ORAL | Status: DC | PRN

## 2013-04-18 MED ORDER — MIDAZOLAM HCL 2 MG/2ML IJ SOLN
INTRAMUSCULAR | Status: AC
  Filled 2013-04-18: qty 2

## 2013-04-18 NOTE — Discharge Summary (Signed)
Discharge Summary   Patient ID: Linda [name] MRN: [id number], DOB/AGE: Mar 30, 1941 72 y.o. Admit date: 04/17/2013 D/C date:     04/18/2013  Primary Cardiologist: Dr. Myrtis Ser  Active Problems:   Hypertension   Hyperlipidemia, mixed   S/P CABG (coronary artery bypass graft)   CAD (coronary artery disease)   COPD (chronic obstructive pulmonary disease)   PAD (peripheral artery disease)   Chest pain   Hospital Course: Linda [name] is a 72 y.o. female with a history of CAD s/p CABG x3v (2002); s/p PCI of the ostium of SVG to OM (2003); NSTEMI s/p PCI with DESx 2 of SVG to OM (10/2012) , PVD s/p stent to right common iliac artery (2012), previous TIAs, HTN, and HLD who presented to the ED yesterday with burning chest pain.  It was an 8/10 burning chest pain associated with SOB and relieved by NTG. She reported that her pain was reminisent of her previous cardiac pain. Her electrocardiogram was nonacute with some nonspecific ST-T wave changes. Troponin x3 were negative. With her long history of CAD and chest pain similar to her previous cardiac chest pain it was elected to do a cardiac cath this morning. Left heart cath revealed severe native CAD with a 60% LM stenosis. The LAD stents are patent. 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 Her SVG to RCA and SVG to OM are patent. LV function is mildly depressed. This was very similar to her previous cath and there was no evidence of any worsening CAD. No interventions were done.   Dr. Elease Hashimoto has seen and evaluated her today and deemed her ready for discharge. She is currently on DAPT s/p PCI with DES x2 in July of this year. She will continue this and resume all of her other home mediations. She is to follow up with Dr. Myrtis Ser, the office will contact her to make a follow up appointment.   Discharge Vitals: Blood pressure 129/61, pulse 85, temperature 97.7 F (36.5 C), temperature source Oral, resp. rate 18, height 5\' 1"  (1.549 m),  weight 141 lb 8.9 oz (64.21 kg), SpO2 93.00%.  Labs: Lab Results  Component Value Date   WBC 5.3 04/18/2013   HGB 12.8 04/18/2013   HCT 38.2 04/18/2013   MCV 90.5 04/18/2013   PLT 195 04/18/2013     Recent Labs Lab 04/18/13 0200  NA 140  K 3.4*  CL 102  CO2 27  BUN 19  CREATININE 0.86  CALCIUM 8.6  PROT 6.4  BILITOT 0.2*  ALKPHOS 40  ALT 17  AST 18  GLUCOSE 106*    Recent Labs  04/17/13 1844 04/18/13 0033 04/18/13 0602  TROPONINI <0.30 <0.30 <0.30   Lab Results  Component Value Date   CHOL 129 04/18/2013   HDL 42 04/18/2013   LDLCALC 55 04/18/2013   TRIG 162* 04/18/2013     Diagnostic Studies/Procedures   Cardiac Cath Note  [birth year]  Procedure: left Heart Cardiac Catheterization Note  Indications: Hx of CAD, CABG  Procedure Details  Consent: Obtained  Time Out: Verified patient identification, verified procedure, site/side was marked, verified correct patient position, special equipment/implants available, Radiology Safety Procedures followed, medications/allergies/relevent history reviewed, required imaging and test results available. Performed  Medications:  Fentanyl: 50 mcg iv  Versed: 2 mg iv  The left femoral artery was easily canulated using a modified Seldinger technique and Smart needle. We attempted to access the RFA but were not able to access the arterial circulation.  Catheters  JL4  JR4  Left coronary bypass  Hemodynamics:  LV pressure: 144/16  Aortic pressure: 133/57  Angiography  Left Main: mild irregularities prox. Distal 60-70% stenosis.  Left anterior Descending: Proximal stents are widely patent  Left Circumflex: proximal stent to the OM - occluded.  Right Coronary Artery: occluded  SVG to RCA: Patent. anastamosis is widely patent. PDA and PLSA are normal  SVG to OM: The proximal stented segment is patent. There is a small amount of instent restenosis in the distal most stent. The OM is normal  LV Gram: small LV. Apical  akinesis/ hypokinesis , EF 45-50%  Complications: No apparent complications  Patient did tolerate procedure well.  Contrast used: 75  Conclusions:  1. Severe native CAD. There is a 60% LM stenosis. The LAD stents are patent.  2. The LIMA is known to be atretic from previous caths  3. Patent SVG to RCA and SVG to OM.  4. LV function is mildly depressed.   Dg Chest 2 View  04/17/2013   CLINICAL DATA:  Chest pain.  Shortness of breath.  EXAM: CHEST  2 VIEW  COMPARISON:  One-view chest 09/26/2004  FINDINGS: The heart is mildly enlarged. And no edema or effusion to suggest failure. Mild interstitial coarsening is stable. Patient is status post median sternotomy CABG. At least 1 of the grafts is stented. The visualized soft tissues and bony thorax are unremarkable.  IMPRESSION: 1. No acute cardiopulmonary disease. 2. Stable cardiomegaly and mild interstitial coarsening without evidence for failure.   Electronically Signed   By: Gennette Pac M.D.   On: 04/17/2013 14:48    Discharge Medications     Medication List         amLODipine 5 MG tablet  Commonly known as:  NORVASC  Take 5 mg by mouth daily.     aspirin 81 MG tablet  Take 81 mg by mouth daily.     atorvastatin 40 MG tablet  Commonly known as:  LIPITOR  Take 40 mg by mouth daily.     clopidogrel 75 MG tablet  Commonly known as:  PLAVIX  Take 1 tablet (75 mg total) by mouth daily.     docusate sodium 100 MG capsule  Commonly known as:  COLACE  Take 200 mg by mouth at bedtime.     Fish Oil 1000 MG Caps  Take 2 capsules (2,000 mg total) by mouth 2 (two) times daily.     gabapentin 100 MG capsule  Commonly known as:  NEURONTIN  Take 100 mg by mouth at bedtime.     isosorbide mononitrate 60 MG 24 hr tablet  Commonly known as:  IMDUR  Take 60 mg by mouth daily.     metoprolol succinate 50 MG 24 hr tablet  Commonly known as:  TOPROL-XL  Take 75 mg by mouth daily.     nitroGLYCERIN 0.4 MG SL tablet  Commonly known as:   NITROSTAT  Place 1 tablet (0.4 mg total) under the tongue every 5 (five) minutes as needed. For chest pain     olmesartan 40 MG tablet  Commonly known as:  BENICAR  Take 40 mg by mouth daily.        Disposition   The patient will be discharged in stable condition to home. Discharge Orders   Future Appointments Provider Department Dept Phone   06/27/2013 8:00 AM [facility] [phone]   06/27/2013 9:00 AM [facility] [phone]   Future Orders Complete By Expires   Diet - low sodium heart healthy  As directed    Increase activity slowly  As directed      Follow-up Information   Follow up with Willa Rough, MD. (Our office will contact you to make an appointment, if you do not hear from them please call the office )    Specialty:  Cardiology   Contact information:   [address] [place] [address] [phone]         Duration of Discharge Encounter: Greater than 30 minutes including physician and PA time.  Urban Gibson PA-C 04/18/2013, 1:56 PM  Attending Note:   The patient was seen and examined.  Agree with assessment and plan as noted above.  Changes made to the above note as needed.  See my note from day of discharge.  No new CAD.  Follow up in the office.   Vesta Mixer, Montez Hageman., MD, [facility] 04/19/2013, 8:50 AM

## 2013-04-18 NOTE — CV Procedure (Signed)
     Cardiac Cath Note  Linda [name] [id number] [birth year]  Procedure: left  Heart Cardiac Catheterization Note Indications:  Hx of CAD, CABG  Procedure Details Consent: Obtained Time Out: Verified patient identification, verified procedure, site/side was marked, verified correct patient position, special equipment/implants available, Radiology Safety Procedures followed,  medications/allergies/relevent history reviewed, required imaging and test results available.  Performed   Medications: Fentanyl: 50 mcg iv Versed:  2 mg iv  The left  femoral artery was easily canulated using a modified Seldinger technique and Smart needle.  We attempted to access the RFA but were not able to access the arterial circulation.  Catheters JL4 JR4 Left coronary bypass  Hemodynamics:    LV pressure: 144/16 Aortic pressure: 133/57  Angiography   Left Main: mild irregularities prox.  Distal 60-70% stenosis.   Left anterior Descending:  Proximal stents are widely patent  Left Circumflex: proximal stent to the OM - occluded.    Right Coronary Artery: occluded  SVG to RCA:  Patent. anastamosis is widely patent. PDA and PLSA are normal  SVG to OM:  The proximal stented segment is patent.  There is a small amount of instent restenosis in the distal most stent.  The OM is normal   LV Gram: small LV.  Apical akinesis/ hypokinesis ,  EF 45-50%   Complications: No apparent complications Patient did tolerate procedure well.  Contrast used: 75   Conclusions:   1.  Severe native CAD.  There is a 60% LM stenosis.  The LAD stents are patent.  2. The LIMA is known to be atretic from previous caths 3. Patent SVG to RCA and SVG to OM. 4. LV function is mildly depressed.   This cath is very similar to her previous cath.  No evidence of any worsening CAD.  She should be able to go home today.     Vesta Mixer, Montez Hageman., MD, [facility] 04/18/2013, 9:08 AM Office - [phone] Pager  [phone]

## 2013-04-18 NOTE — Progress Notes (Signed)
UR completed 

## 2013-04-18 NOTE — Interval H&P Note (Signed)
History and Physical Interval Note:  04/18/2013 8:16 AM  Linda [name]  has presented today for surgery, with the diagnosis of Chest pain  The various methods of treatment have been discussed with the patient and family. After consideration of risks, benefits and other options for treatment, the patient has consented to  Procedure(s): LEFT HEART CATHETERIZATION WITH CORONARY ANGIOGRAM (N/A) as a surgical intervention .  The patient's history has been reviewed, patient examined, no change in status, stable for surgery.  I have reviewed the patient's chart and labs.  Questions were answered to the patient's satisfaction.      Cath Lab Visit (complete for each Cath Lab visit)  Clinical Evaluation Leading to the Procedure:   ACS: no  Non-ACS:    Anginal Classification: CCS III  Anti-ischemic medical therapy: Maximal Therapy (2 or more classes of medications)  Non-Invasive Test Results: No non-invasive testing performed  Prior CABG: Previous CABG        Elyn Aquas.

## 2013-04-18 NOTE — Progress Notes (Signed)
ANTICOAGULATION CONSULT NOTE - Follow Up Consult  Pharmacy Consult for Heparin  Indication: chest pain/ACS  Allergies  Allergen Reactions  . Sulfonamide Derivatives Rash and Other (See Comments)    Reaction: fever and rash    Patient Measurements: Height: 5\' 1"  (154.9 cm) Weight: 141 lb 8.9 oz (64.21 kg) IBW/kg (Calculated) : 47.8  Vital Signs: Temp: 97.7 F (36.5 C) (12/23 2211) Temp src: Oral (12/23 2211) BP: 131/55 mmHg (12/23 2211) Pulse Rate: 48 (12/23 2211)  Labs:  Recent Labs  04/17/13 1351 04/17/13 1844 04/18/13 0033 04/18/13 0200  HGB 13.9  --   --  12.8  HCT 42.1  --   --  38.2  PLT 204  --   --  195  LABPROT  --  13.6  --  13.2  INR  --  1.06  --  1.02  HEPARINUNFRC  --   --   --  0.38  CREATININE 0.69  --   --   --   TROPONINI  --  <0.30 <0.30  --     Estimated Creatinine Clearance: 55.4 ml/min (by C-G formula based on Cr of 0.69).  Medications:  Heparin 800 units/hr  Assessment: 72 y/o F on heparin for CP/ACS. First HL is 0.38. Other labs as above.   Goal of Therapy:  Heparin level 0.3-0.7 units/ml Monitor platelets by anticoagulation protocol: Yes   Plan:  -Continue heparin at 800 units/hr -8 hour HL at 1000, pending cath -Daily CBC/HL, pending cath -Monitor for bleeding  Thank you for allowing me to take part in this patient's care,  Abran Duke, PharmD Clinical Pharmacist Phone: [phone] Pager: [phone] 04/18/2013 3:09 AM

## 2013-04-23 ENCOUNTER — Telehealth: Payer: Self-pay | Admitting: Cardiology

## 2013-04-23 NOTE — Telephone Encounter (Signed)
Pt had cath done on Wednesday 04-18-2013. Pt removed bandages on Sunday 12-28. Pt states that there was some dry blood on site this morning but she can feel that it is scab over. Pt states that it is no swollen or hot to touch. Pt has some pain in her stomach where the Cath was first attempted. [place] to [place], reminded pt of follow up appointment on 05-09-13 and informed her if she had any issues to give [place] a call. Will forward to MD.

## 2013-04-26 HISTORY — PX: CATARACT EXTRACTION W/ INTRAOCULAR LENS  IMPLANT, BILATERAL: SHX1307

## 2013-05-04 ENCOUNTER — Encounter: Payer: Self-pay | Admitting: Cardiology

## 2013-05-09 ENCOUNTER — Encounter: Payer: Medicare Other | Admitting: Cardiology

## 2013-06-04 ENCOUNTER — Telehealth: Payer: Self-pay | Admitting: Cardiovascular Disease

## 2013-06-08 ENCOUNTER — Encounter: Payer: Self-pay | Admitting: Cardiology

## 2013-06-08 ENCOUNTER — Ambulatory Visit (INDEPENDENT_AMBULATORY_CARE_PROVIDER_SITE_OTHER): Payer: Medicare Other | Admitting: Cardiology

## 2013-06-08 VITALS — BP 172/94 | HR 63 | Ht 61.0 in | Wt 144.2 lb

## 2013-06-08 DIAGNOSIS — M25512 Pain in left shoulder: Secondary | ICD-10-CM

## 2013-06-08 DIAGNOSIS — I2510 Atherosclerotic heart disease of native coronary artery without angina pectoris: Secondary | ICD-10-CM

## 2013-06-08 DIAGNOSIS — M25519 Pain in unspecified shoulder: Secondary | ICD-10-CM

## 2013-06-08 DIAGNOSIS — E782 Mixed hyperlipidemia: Secondary | ICD-10-CM

## 2013-06-08 DIAGNOSIS — I10 Essential (primary) hypertension: Secondary | ICD-10-CM

## 2013-06-08 DIAGNOSIS — I739 Peripheral vascular disease, unspecified: Secondary | ICD-10-CM

## 2013-06-08 MED ORDER — LOSARTAN POTASSIUM 100 MG PO TABS: 100.0000 mg | ORAL_TABLET | Freq: Every day | ORAL | Status: DC

## 2013-06-08 NOTE — Assessment & Plan Note (Signed)
I have outlined in the first lines above that she had a followup April 18, 2013. There was no change. She was stable. She will continue dual antiplatelet therapy indefinitely.

## 2013-06-08 NOTE — Patient Instructions (Signed)
Your physician recommends that you schedule a follow-up appointment in: 6 months. You will receive a reminder letter in the mail in about 4 months reminding you to call and schedule your appointment. If you don't receive this letter, please contact our office. Your physician has recommended you make the following change in your medication: STOP BENICAR.  START LOSARTAN 100 MG DAILY. Your new prescription has been sent to your pharmacy. All other meds will remain the same.

## 2013-06-08 NOTE — Assessment & Plan Note (Signed)
She describes some left shoulder discomfort. I believe this is not cardiac in origin. No further workup.  The patient and I talked about what would happen if she became ill and was closest to the hospital in [place]. I made it clear to her that she could ask for us. [facility] would be able to come to the hospital and dental [facility]. I will continue to follow her in the [place] office for her outpatient care.  As part of today's evaluation I spent greater than 25 minutes with a total care. More than half of this time was with the patient directly discussing all of the issues I have mentioned

## 2013-06-08 NOTE — Progress Notes (Signed)
HPI  Patient is seen to followup her coronary disease. She's also seen post hospitalization from April 18, 2013. I saw the patient last in the office March 12, 2013. She seems stable at that time. The plan was to keep her on dual antiplatelet therapy in definitely. She was being actively followed at that time by Dr. Kirke CorinArida for her peripheral vascular disease.  I received a phone call from her on April 18, 2014. She was headed towards our office but said that she had the exact same pain that she had with her recent ischemic event. She was rerouted to [place]. Cardiac enzymes were normal. Repeat catheterization was done. There was no significant change. She had old severe coronary disease with 60% left main stenosis. The LAD stents were patent. LIMA was atretic and this was known from the past. Vein graft to the right and OM were patent. There was no significant change. She was discharged home. She has actually done well since that time.  As part of today's evaluation I have very carefully reviewed all of the hospital records during that admission. The patient does mention today some sharp discomfort that occurs in her left scapula and left shoulder. This is random. It does not feel like any of her prior symptoms.  Allergies  Allergen Reactions  . Sulfonamide Derivatives Rash and Other (See Comments)    Reaction: fever and rash    Current Outpatient Prescriptions  Medication Sig Dispense Refill  . amLODipine (NORVASC) 5 MG tablet Take 5 mg by mouth daily.        Marland Kitchen. aspirin 81 MG tablet Take 81 mg by mouth daily.        Marland Kitchen. atorvastatin (LIPITOR) 40 MG tablet Take 40 mg by mouth daily.      . clopidogrel (PLAVIX) 75 MG tablet Take 1 tablet (75 mg total) by mouth daily.  30 tablet  11  . docusate sodium (COLACE) 100 MG capsule Take 200 mg by mouth at bedtime.       . gabapentin (NEURONTIN) 100 MG capsule Take 100 mg by mouth at bedtime.      . isosorbide mononitrate (IMDUR) 60 MG 24  hr tablet Take 60 mg by mouth daily.       . metoprolol (TOPROL-XL) 50 MG 24 hr tablet Take 75 mg by mouth daily.       . nitroGLYCERIN (NITROSTAT) 0.4 MG SL tablet Place 1 tablet (0.4 mg total) under the tongue every 5 (five) minutes as needed. For chest pain  25 tablet  12  . Omega-3 Fatty Acids (FISH OIL) 1000 MG CAPS Take 2 capsules (2,000 mg total) by mouth 2 (two) times daily.      Marland Kitchen. losartan (COZAAR) 100 MG tablet Take 1 tablet (100 mg total) by mouth daily.  30 tablet  6   No current facility-administered medications for this visit.    History   Social History  . Marital Status: Married    Spouse Name: N/A    Number of Children: N/A  . Years of Education: N/A   Occupational History  . Retired    Social History Main Topics  . Smoking status: Former Smoker -- 1.00 packs/day for 35 years    Types: Cigarettes    Quit date: 03/30/2001  . Smokeless tobacco: Former NeurosurgeonUser    Types: Snuff  . Alcohol Use: No  . Drug Use: No  . Sexual Activity: No   Other Topics Concern  . Not on file  Social History Narrative   She lives is [place] with husband and has two biological children.      Family History  Problem Relation Age of Onset  . Coronary artery disease    . Heart attack Mother   . Heart failure Father   . Cancer Mother   . Heart failure Maternal Grandmother   . Diabetes Maternal Grandmother   . Kidney disease Paternal Grandfather     Past Medical History  Diagnosis Date  . Hypertension     Unspecified   . Hyperlipidemia, mixed   . CAD (coronary artery disease)     a. s/p CABG x3v (2002); NSTEMI s/p PCI of the ostium of SVG to OM (2003); PCI with DES of SVG to OM (10/2012)   . Bradycardia   . PAD (peripheral artery disease)     a. s/p stent to right common iliac artery (2012)  . TIA (transient ischemic attack)   . Claudication   . Renal artery stenosis     95% stenosis  known from the past., Small left kidney, medical therapy  . Carotid artery disease      November, 2013  //   Doppler, March, 2014, stable, 0-39% bilateral, followup 2 years  . Myocardial infarction 11/15/2012  . Daily headache   . Shoulder pain, left     Past Surgical History  Procedure Laterality Date  . Coronary artery bypass graft  03/2001    "CABG X3" (11/15/2012)  . Cardiac catheterization  2012    "several in the last 12 years" (11/15/2012)  . Coronary angioplasty with stent placement      "counting the 2 they put in today I've got 6 or 7 stents total" (11/15/2012)  . Tonsillectomy and adenoidectomy  ~ 1948  . Knee surgery Left ~ 2004    "shattered" (11/15/2012)    Patient Active Problem List   Diagnosis Date Noted  . Shoulder pain, left   . Chest pain 04/17/2013  . Bilateral leg edema 03/12/2013  . Carotid artery disease   . PAD (peripheral artery disease)   . Renal artery stenosis   . Claudication   . Tachycardia   . Hypertension   . Hyperlipidemia, mixed   . S/P CABG (coronary artery bypass graft)   . CAD (coronary artery disease)   . Bradycardia   . COPD (chronic obstructive pulmonary disease)   . TIA (transient ischemic attack)   . Hematoma   . Ejection fraction     ROS   Patient denies fever, chills, headache, sweats, rash, change in vision, change in hearing, chest pain, cough, nausea vomiting, urinary symptoms. All other systems are reviewed and are negative.  PHYSICAL EXAM  Patient is oriented to person time and place. Affect is normal. There is no jugulovenous distention. Lungs are clear. Respiratory effort is nonlabored. Cardiac exam reveals S1 and S2. There no clicks or significant murmurs. The abdomen is soft. There is no peripheral edema. There no musculoskeletal deformities. There are no skin rashes.  Filed Vitals:   06/08/13 0909  BP: 172/94  Pulse: 63  Height: 5\' 1"  (1.549 m)  Weight: 144 lb 4 oz (65.431 kg)     ASSESSMENT & PLAN

## 2013-06-08 NOTE — Assessment & Plan Note (Signed)
She is taking her statin appropriately. No change in therapy.

## 2013-06-08 NOTE — Assessment & Plan Note (Signed)
Blood pressure is elevated in the office today. She is insistent that it is normal at home and normal at her primary care office. I will not be adjusting doses of medicines. However she has been on Benicar and realizes how expensive this is. We will switch her to Cozaar 100 mg daily.

## 2013-06-08 NOTE — Assessment & Plan Note (Signed)
She has significant peripheral arterial disease. She appreciates greatly the excellent care from Dr. Kirke CorinArida. She will have followup Doppler soon and she will be seeing him in the office for followups and also.

## 2013-06-27 ENCOUNTER — Encounter (INDEPENDENT_AMBULATORY_CARE_PROVIDER_SITE_OTHER): Payer: Medicare Other

## 2013-06-27 DIAGNOSIS — I739 Peripheral vascular disease, unspecified: Secondary | ICD-10-CM

## 2013-06-27 DIAGNOSIS — I700 Atherosclerosis of aorta: Secondary | ICD-10-CM

## 2013-07-03 ENCOUNTER — Ambulatory Visit (INDEPENDENT_AMBULATORY_CARE_PROVIDER_SITE_OTHER): Payer: Medicare Other | Admitting: Cardiovascular Disease

## 2013-07-03 ENCOUNTER — Encounter: Payer: Self-pay | Admitting: Cardiovascular Disease

## 2013-07-03 VITALS — BP 140/78 | HR 65 | Ht 61.0 in | Wt 143.0 lb

## 2013-07-03 DIAGNOSIS — I2510 Atherosclerotic heart disease of native coronary artery without angina pectoris: Secondary | ICD-10-CM

## 2013-07-03 DIAGNOSIS — I739 Peripheral vascular disease, unspecified: Secondary | ICD-10-CM

## 2013-07-03 NOTE — Progress Notes (Signed)
Primary cardiologist: Dr. Myrtis Ser Primary care physician: Dr. Sherryll Burger  HPI  This is a 16 pleasant female who is here today for a followup visit regarding peripheral arterial disease. The patient has known history of coronary artery disease status post CABG. she presented in July of 2014 with non-ST elevation myocardial infarction. I performed cardiac catheterization which showed subtotal occlusion of SVG to OM in the proximal segment with ostial in-stent restenosis as well. PCI in 2 drug-eluting stents were placed in the proximal and ostial graft. She had recurrent chest pain in December. She underwent cardiac catheterization which showed patent grafts and stents.  She is known to have peripheral arterial disease and  seen in 09/2011 for severe discomfort mostly in the buttock and thigh area in the right side but not much on the left. Her right femoral artery pulse was absent with an ABI of 0.59. She underwent angiography in 10/2011 which showed 90% stenosis in the right common iliac artery. There was a 95% left renal artery stenosis which was described on previous angiography 2 years ago. The left renal artery appeared small with very small left kidney size. She underwent successful angioplasty and stent placement to the right common iliac artery without complications.  Duplex ultrasound that showed evidence of restenosis.  However, ABI has remained normal. She denies claudication. Recent aortoiliac duplex tachycardia showed improved velocities.  She needs cataract surgery done. Advised no to come off Plavix at least until 10/2013.    Allergies  Allergen Reactions  . Sulfonamide Derivatives Rash and Other (See Comments)    Reaction: fever and rash     Current Outpatient Prescriptions on File Prior to Visit  Medication Sig Dispense Refill  . amLODipine (NORVASC) 5 MG tablet Take 5 mg by mouth daily.        Marland Kitchen aspirin 81 MG tablet Take 81 mg by mouth daily.        Marland Kitchen atorvastatin (LIPITOR) 40 MG  tablet Take 40 mg by mouth daily.      . clopidogrel (PLAVIX) 75 MG tablet Take 1 tablet (75 mg total) by mouth daily.  30 tablet  11  . docusate sodium (COLACE) 100 MG capsule Take 200 mg by mouth at bedtime.       . gabapentin (NEURONTIN) 100 MG capsule Take 100 mg by mouth at bedtime.      . isosorbide mononitrate (IMDUR) 60 MG 24 hr tablet Take 60 mg by mouth daily.       Marland Kitchen losartan (COZAAR) 100 MG tablet Take 1 tablet (100 mg total) by mouth daily.  30 tablet  6  . metoprolol (TOPROL-XL) 50 MG 24 hr tablet Take 75 mg by mouth daily.       . nitroGLYCERIN (NITROSTAT) 0.4 MG SL tablet Place 1 tablet (0.4 mg total) under the tongue every 5 (five) minutes as needed. For chest pain  25 tablet  12  . Omega-3 Fatty Acids (FISH OIL) 1000 MG CAPS Take 2 capsules (2,000 mg total) by mouth 2 (two) times daily.       No current facility-administered medications on file prior to visit.     Past Medical History  Diagnosis Date  . Hypertension     Unspecified   . Hyperlipidemia, mixed   . CAD (coronary artery disease)     a. s/p CABG x3v (2002); NSTEMI s/p PCI of the ostium of SVG to OM (2003); PCI with DES of SVG to OM (10/2012)   . Bradycardia   .  PAD (peripheral artery disease)     a. s/p stent to right common iliac artery (2012)  . TIA (transient ischemic attack)   . Claudication   . Renal artery stenosis     95% stenosis  known from the past., Small left kidney, medical therapy  . Carotid artery disease     November, 2013  //   Doppler, March, 2014, stable, 0-39% bilateral, followup 2 years  . Myocardial infarction 11/15/2012  . Daily headache   . Shoulder pain, left      Past Surgical History  Procedure Laterality Date  . Coronary artery bypass graft  03/2001    "CABG X3" (11/15/2012)  . Cardiac catheterization  2012    "several in the last 12 years" (11/15/2012)  . Coronary angioplasty with stent placement      "counting the 2 they put in today I've got 6 or 7 stents total"  (11/15/2012)  . Tonsillectomy and adenoidectomy  ~ 1948  . Knee surgery Left ~ 2004    "shattered" (11/15/2012)     Family History  Problem Relation Age of Onset  . Coronary artery disease    . Heart attack Mother   . Heart failure Father   . Cancer Mother   . Heart failure Maternal Grandmother   . Diabetes Maternal Grandmother   . Kidney disease Paternal Grandfather      History   Social History  . Marital Status: Married    Spouse Name: N/A    Number of Children: N/A  . Years of Education: N/A   Occupational History  . Retired    Social History Main Topics  . Smoking status: Former Smoker -- 1.00 packs/day for 35 years    Types: Cigarettes    Quit date: 03/30/2001  . Smokeless tobacco: Former NeurosurgeonUser    Types: Snuff  . Alcohol Use: No  . Drug Use: No  . Sexual Activity: No   Other Topics Concern  . Not on file   Social History Narrative   She lives is [place] with husband and has two biological children.       PHYSICAL EXAM   BP 140/78  Pulse 65  Ht 5\' 1"  (1.549 m)  Wt 143 lb (64.864 kg)  BMI 27.03 kg/m2  Constitutional: She is oriented to person, place, and time. She appears well-developed and well-nourished. No distress.  HENT: No nasal discharge.  Head: Normocephalic and atraumatic.  Eyes: Pupils are equal and round. Right eye exhibits no discharge. Left eye exhibits no discharge.  Neck: Normal range of motion. Neck supple. No JVD present. No thyromegaly present.  Cardiovascular: Normal rate, regular rhythm, normal heart sounds. Exam reveals no gallop and no friction rub. No murmur heard.  Pulmonary/Chest: Effort normal and breath sounds normal. No stridor. No respiratory distress. She has no wheezes. She has no rales. She exhibits no tenderness.  Abdominal: Soft. Bowel sounds are normal. She exhibits no distension. There is no tenderness. There is no rebound and no guarding.  Musculoskeletal: Normal range of motion. She exhibits no edema and no  tenderness.  Neurological: She is alert and oriented to person, place, and time. Coordination normal.  Skin: Skin is warm and dry. No rash noted. She is not diaphoretic. No erythema. No pallor.  Psychiatric: She has a normal mood and affect. Her behavior is normal. Judgment and thought content normal.  Femoral pulses are +1 bilaterally , posterior tibial is palpable bilaterally.   ASSESSMENT AND PLAN

## 2013-07-03 NOTE — Patient Instructions (Signed)
Your physician has requested that you have an ankle brachial index (ABI). During this test an ultrasound and blood pressure cuff are used to evaluate the arteries that supply the arms and legs with blood. Allow thirty minutes for this exam. There are no restrictions or special instructions.  Your physician has requested that you have an aortoiliac duplex. During this test, an ultrasound is used to evaluate the aorta and iliac. Allow 30 minutes for this exam. Do not eat after midnight the day before and avoid carbonated beverages  Your physician wants you to follow-up in: 1 year with Dr. Kirke CorinArida about a week after your testing is completed.   You will receive a reminder letter in the mail two months in advance. If you don't receive a letter, please call our office to schedule the follow-up appointment.  Your physician recommends that you continue on your current medications as directed. Please refer to the Current Medication list given to you today.

## 2013-07-07 NOTE — Assessment & Plan Note (Signed)
She continues to do well with no recurrent claudication. Recent aortoiliac duplex actually showed improved velocity in both iliac arteries. ABI remains normal. Continue observation. I will plan on following Linda [name] on a yearly basis and will request an aortoiliac duplex and ABI in one year.

## 2013-07-07 NOTE — Assessment & Plan Note (Signed)
She is doing well with no recurrent angina. Cardiac catheterization in December showed patent grafts and stents. Dual antiplatelet therapy cannot be interrupted at least until July of 2015 . she needs cataract surgery done.

## 2013-08-28 NOTE — Telephone Encounter (Signed)
error 

## 2013-09-14 ENCOUNTER — Emergency Department (HOSPITAL_COMMUNITY): Payer: Medicare Other

## 2013-09-14 ENCOUNTER — Observation Stay (HOSPITAL_COMMUNITY)
Admission: EM | Admit: 2013-09-14 | Discharge: 2013-09-15 | Disposition: A | Discharge status: Home / Self Care | Payer: Medicare Other | Attending: Internal Medicine | Admitting: Internal Medicine

## 2013-09-14 ENCOUNTER — Encounter (HOSPITAL_COMMUNITY): Payer: Self-pay | Admitting: Emergency Medicine

## 2013-09-14 ENCOUNTER — Telehealth: Payer: Self-pay | Admitting: Cardiology

## 2013-09-14 DIAGNOSIS — I498 Other specified cardiac arrhythmias: Secondary | ICD-10-CM | POA: Insufficient documentation

## 2013-09-14 DIAGNOSIS — Z7982 Long term (current) use of aspirin: Secondary | ICD-10-CM | POA: Insufficient documentation

## 2013-09-14 DIAGNOSIS — R072 Precordial pain: Principal | ICD-10-CM | POA: Insufficient documentation

## 2013-09-14 DIAGNOSIS — I701 Atherosclerosis of renal artery: Secondary | ICD-10-CM | POA: Insufficient documentation

## 2013-09-14 DIAGNOSIS — I252 Old myocardial infarction: Secondary | ICD-10-CM | POA: Insufficient documentation

## 2013-09-14 DIAGNOSIS — I739 Peripheral vascular disease, unspecified: Secondary | ICD-10-CM | POA: Insufficient documentation

## 2013-09-14 DIAGNOSIS — G459 Transient cerebral ischemic attack, unspecified: Secondary | ICD-10-CM | POA: Insufficient documentation

## 2013-09-14 DIAGNOSIS — Z7902 Long term (current) use of antithrombotics/antiplatelets: Secondary | ICD-10-CM | POA: Insufficient documentation

## 2013-09-14 DIAGNOSIS — Z9861 Coronary angioplasty status: Secondary | ICD-10-CM | POA: Insufficient documentation

## 2013-09-14 DIAGNOSIS — Z951 Presence of aortocoronary bypass graft: Secondary | ICD-10-CM

## 2013-09-14 DIAGNOSIS — M25519 Pain in unspecified shoulder: Secondary | ICD-10-CM | POA: Insufficient documentation

## 2013-09-14 DIAGNOSIS — E782 Mixed hyperlipidemia: Secondary | ICD-10-CM | POA: Diagnosis present

## 2013-09-14 DIAGNOSIS — R609 Edema, unspecified: Secondary | ICD-10-CM | POA: Insufficient documentation

## 2013-09-14 DIAGNOSIS — I6529 Occlusion and stenosis of unspecified carotid artery: Secondary | ICD-10-CM | POA: Insufficient documentation

## 2013-09-14 DIAGNOSIS — R000 Tachycardia, unspecified: Secondary | ICD-10-CM | POA: Insufficient documentation

## 2013-09-14 DIAGNOSIS — R42 Dizziness and giddiness: Secondary | ICD-10-CM | POA: Insufficient documentation

## 2013-09-14 DIAGNOSIS — I779 Disorder of arteries and arterioles, unspecified: Secondary | ICD-10-CM

## 2013-09-14 DIAGNOSIS — J449 Chronic obstructive pulmonary disease, unspecified: Secondary | ICD-10-CM | POA: Diagnosis present

## 2013-09-14 DIAGNOSIS — R600 Localized edema: Secondary | ICD-10-CM

## 2013-09-14 DIAGNOSIS — I10 Essential (primary) hypertension: Secondary | ICD-10-CM | POA: Diagnosis present

## 2013-09-14 DIAGNOSIS — R9430 Abnormal result of cardiovascular function study, unspecified: Secondary | ICD-10-CM

## 2013-09-14 DIAGNOSIS — R0602 Shortness of breath: Secondary | ICD-10-CM | POA: Insufficient documentation

## 2013-09-14 DIAGNOSIS — M25512 Pain in left shoulder: Secondary | ICD-10-CM

## 2013-09-14 DIAGNOSIS — T148XXA Other injury of unspecified body region, initial encounter: Secondary | ICD-10-CM

## 2013-09-14 DIAGNOSIS — Z79899 Other long term (current) drug therapy: Secondary | ICD-10-CM | POA: Insufficient documentation

## 2013-09-14 DIAGNOSIS — R079 Chest pain, unspecified: Secondary | ICD-10-CM

## 2013-09-14 DIAGNOSIS — I2510 Atherosclerotic heart disease of native coronary artery without angina pectoris: Secondary | ICD-10-CM | POA: Diagnosis present

## 2013-09-14 DIAGNOSIS — M7981 Nontraumatic hematoma of soft tissue: Secondary | ICD-10-CM | POA: Insufficient documentation

## 2013-09-14 DIAGNOSIS — J4489 Other specified chronic obstructive pulmonary disease: Secondary | ICD-10-CM | POA: Insufficient documentation

## 2013-09-14 DIAGNOSIS — Z87891 Personal history of nicotine dependence: Secondary | ICD-10-CM | POA: Insufficient documentation

## 2013-09-14 DIAGNOSIS — R001 Bradycardia, unspecified: Secondary | ICD-10-CM

## 2013-09-14 DIAGNOSIS — Z9889 Other specified postprocedural states: Secondary | ICD-10-CM | POA: Insufficient documentation

## 2013-09-14 LAB — CBC WITH DIFFERENTIAL/PLATELET
BASOS ABS: 0 10*3/uL (ref 0.0–0.1)
Basophils Relative: 1 % (ref 0–1)
Eosinophils Absolute: 0.1 10*3/uL (ref 0.0–0.7)
Eosinophils Relative: 2 % (ref 0–5)
HCT: 38.6 % (ref 36.0–46.0)
Hemoglobin: 12.7 g/dL (ref 12.0–15.0)
LYMPHS PCT: 28 % (ref 12–46)
Lymphs Abs: 1.6 10*3/uL (ref 0.7–4.0)
MCH: 30.8 pg (ref 26.0–34.0)
MCHC: 32.9 g/dL (ref 30.0–36.0)
MCV: 93.7 fL (ref 78.0–100.0)
Monocytes Absolute: 0.6 10*3/uL (ref 0.1–1.0)
Monocytes Relative: 10 % (ref 3–12)
Neutro Abs: 3.4 10*3/uL (ref 1.7–7.7)
Neutrophils Relative %: 59 % (ref 43–77)
Platelets: 195 10*3/uL (ref 150–400)
RBC: 4.12 MIL/uL (ref 3.87–5.11)
RDW: 13.3 % (ref 11.5–15.5)
WBC: 5.6 10*3/uL (ref 4.0–10.5)

## 2013-09-14 LAB — COMPREHENSIVE METABOLIC PANEL
ALT: 14 U/L (ref 0–35)
AST: 16 U/L (ref 0–37)
Albumin: 3.8 g/dL (ref 3.5–5.2)
Alkaline Phosphatase: 42 U/L (ref 39–117)
BUN: 17 mg/dL (ref 6–23)
CALCIUM: 9.4 mg/dL (ref 8.4–10.5)
CO2: 26 mEq/L (ref 19–32)
Chloride: 102 mEq/L (ref 96–112)
Creatinine, Ser: 0.84 mg/dL (ref 0.50–1.10)
GFR, EST AFRICAN AMERICAN: 79 mL/min — AB (ref >90)
GFR, EST NON AFRICAN AMERICAN: 68 mL/min — AB (ref >90)
GLUCOSE: 92 mg/dL (ref 70–99)
Potassium: 3.8 mEq/L (ref 3.7–5.3)
Sodium: 142 mEq/L (ref 137–147)
Total Bilirubin: 0.3 mg/dL (ref 0.3–1.2)
Total Protein: 7.2 g/dL (ref 6.0–8.3)

## 2013-09-14 LAB — TROPONIN I: Troponin I: <0.3 ng/mL (ref <0.30)

## 2013-09-14 LAB — I-STAT TROPONIN, ED: TROPONIN I, POC: 0 ng/mL (ref 0.00–0.08)

## 2013-09-14 LAB — TSH: TSH: 4.95 u[IU]/mL — ABNORMAL HIGH (ref 0.350–4.500)

## 2013-09-14 MED ORDER — ATORVASTATIN CALCIUM 40 MG PO TABS
40.0000 mg | ORAL_TABLET | Freq: Every day | ORAL | Status: DC
  Administered 2013-09-14: 40 mg via ORAL
  Filled 2013-09-14 (×2): qty 1

## 2013-09-14 MED ORDER — ISOSORBIDE MONONITRATE ER 60 MG PO TB24
60.0000 mg | ORAL_TABLET | Freq: Every day | ORAL | Status: DC
  Administered 2013-09-15: 60 mg via ORAL
  Filled 2013-09-14: qty 1

## 2013-09-14 MED ORDER — CLONIDINE HCL 0.1 MG PO TABS
0.1000 mg | ORAL_TABLET | Freq: Every day | ORAL | Status: DC | PRN
  Filled 2013-09-14: qty 1

## 2013-09-14 MED ORDER — CLOPIDOGREL BISULFATE 75 MG PO TABS
75.0000 mg | ORAL_TABLET | Freq: Every day | ORAL | Status: DC
  Administered 2013-09-15: 75 mg via ORAL
  Filled 2013-09-14: qty 1

## 2013-09-14 MED ORDER — ASPIRIN 81 MG PO TABS: 81.0000 mg | ORAL_TABLET | Freq: Every day | ORAL | Status: DC

## 2013-09-14 MED ORDER — DOCUSATE SODIUM 100 MG PO CAPS
200.0000 mg | ORAL_CAPSULE | Freq: Every day | ORAL | Status: DC
  Administered 2013-09-14: 200 mg via ORAL
  Filled 2013-09-14 (×2): qty 2

## 2013-09-14 MED ORDER — METOPROLOL SUCCINATE ER 50 MG PO TB24
75.0000 mg | ORAL_TABLET | Freq: Every day | ORAL | Status: DC
  Administered 2013-09-15: 75 mg via ORAL
  Filled 2013-09-14: qty 1

## 2013-09-14 MED ORDER — PANTOPRAZOLE SODIUM 40 MG PO TBEC: 40.0000 mg | DELAYED_RELEASE_TABLET | Freq: Every day | ORAL | Status: DC

## 2013-09-14 MED ORDER — NITROGLYCERIN 0.4 MG SL SUBL: 0.4000 mg | SUBLINGUAL_TABLET | SUBLINGUAL | Status: DC | PRN

## 2013-09-14 MED ORDER — GABAPENTIN 100 MG PO CAPS
100.0000 mg | ORAL_CAPSULE | Freq: Every day | ORAL | Status: DC
  Administered 2013-09-14: 100 mg via ORAL
  Filled 2013-09-14 (×2): qty 1

## 2013-09-14 MED ORDER — ASPIRIN 81 MG PO CHEW
81.0000 mg | CHEWABLE_TABLET | Freq: Every day | ORAL | Status: DC
  Administered 2013-09-15: 81 mg via ORAL
  Filled 2013-09-14: qty 1

## 2013-09-14 MED ORDER — OMEGA-3-ACID ETHYL ESTERS 1 G PO CAPS
1.0000 g | ORAL_CAPSULE | Freq: Two times a day (BID) | ORAL | Status: DC
  Administered 2013-09-14 – 2013-09-15 (×2): 1 g via ORAL
  Filled 2013-09-14 (×3): qty 1

## 2013-09-14 MED ORDER — IRBESARTAN 300 MG PO TABS
300.0000 mg | ORAL_TABLET | Freq: Every day | ORAL | Status: DC
  Administered 2013-09-15: 300 mg via ORAL
  Filled 2013-09-14 (×2): qty 1

## 2013-09-14 MED ORDER — ASPIRIN 325 MG PO TABS
325.0000 mg | ORAL_TABLET | ORAL | Status: AC
  Administered 2013-09-14: 325 mg via ORAL
  Filled 2013-09-14: qty 1

## 2013-09-14 MED ORDER — AMLODIPINE BESYLATE 5 MG PO TABS
5.0000 mg | ORAL_TABLET | Freq: Every day | ORAL | Status: DC
  Administered 2013-09-15: 5 mg via ORAL
  Filled 2013-09-14 (×2): qty 1

## 2013-09-14 NOTE — H&P (Signed)
Cardiologist: Linda [name] is an 73 y.o. female.   Chief Complaint:  Chest pain HPI:   Is a 73 year old Caucasian female with a history of coronary artery disease status post coronary bypass grafting x3 in 2002 with an end STEMI PCR the ostium of the SVG to the OM 2003 and PCI DES to the SVG to OM in July 2014. History also includes hypertension, hyperlipidemia, bradycardia, peripheral artery disease status post stent to the right common iliac artery in 2012, TIA, renal artery stenosis, carotid artery disease.  Patient's last cardiac catheterization was December 2014 showed severe native CAD. A 60% left main stenosis and the LAD stents were patent.  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 Patent SVG to RCA and SVG to OM.  LV function is mildly depressed.   Patient is given to the emergency room up in [place] for 3 nights in a row first 2 nights were for elevated blood pressure in the 90s. She was started on clonidine.  Last night however, she developed chest burning sensation 8/10 intensity with radiation to the left arm and mild shortness of breath with some mild dizziness.  She was again seen at the emergency room in [place] her negative cardiac enzymes. She developed chest pain began today and call our office and was told to come to the emergency room.  The patient currently denies nausea, vomiting, fever,  orthopnea, dizziness, PND, cough, congestion, abdominal pain, hematochezia, melena, lower extremity edema.    Medications: Prior to Admission medications   Medication Sig Start Date End Date Taking? Authorizing Provider  amLODipine (NORVASC) 5 MG tablet Take 5 mg by mouth daily.     Yes Historical Provider, MD  aspirin 81 MG tablet Take 81 mg by mouth daily.     Yes Historical Provider, MD  atorvastatin (LIPITOR) 40 MG tablet Take 40 mg by mouth daily.   Yes Historical Provider, MD  cloNIDine (CATAPRES) 0.1 MG tablet Take 0.1 mg by mouth daily as needed (for high  BP).   Yes Historical Provider, MD  clopidogrel (PLAVIX) 75 MG tablet Take 1 tablet (75 mg total) by mouth daily. 11/16/12  Yes Rhonda G Barrett, PA-C  docusate sodium (COLACE) 100 MG capsule Take 200 mg by mouth at bedtime.    Yes Historical Provider, MD  gabapentin (NEURONTIN) 100 MG capsule Take 100 mg by mouth at bedtime.   Yes Historical Provider, MD  isosorbide mononitrate (IMDUR) 60 MG 24 hr tablet Take 60 mg by mouth daily.    Yes Historical Provider, MD  metoprolol (TOPROL-XL) 50 MG 24 hr tablet Take 75 mg by mouth daily.    Yes Historical Provider, MD  nitroGLYCERIN (NITROSTAT) 0.4 MG SL tablet Place 1 tablet (0.4 mg total) under the tongue every 5 (five) minutes as needed. For chest pain 11/16/12  Yes Rhonda G Barrett, PA-C  olmesartan (BENICAR) 40 MG tablet Take 40 mg by mouth daily.   Yes Historical Provider, MD  Omega-3 Fatty Acids (FISH OIL) 1000 MG CAPS Take 2 capsules (2,000 mg total) by mouth 2 (two) times daily. 04/18/13  Yes Perry Mount, PA-C      Past Medical History  Diagnosis Date  . Hypertension     Unspecified   . Hyperlipidemia, mixed   . CAD (coronary artery disease)     a. s/p CABG x3v (2002); NSTEMI s/p PCI of the ostium of SVG to OM (2003); PCI with DES of SVG to OM (10/2012)   .  Bradycardia   . PAD (peripheral artery disease)     a. s/p stent to right common iliac artery (2012)  . TIA (transient ischemic attack)   . Claudication   . Renal artery stenosis     95% stenosis  known from the past., Small left kidney, medical therapy  . Carotid artery disease     November, 2013  //   Doppler, March, 2014, stable, 0-39% bilateral, followup 2 years  . Myocardial infarction 11/15/2012  . Daily headache   . Shoulder pain, left     Past Surgical History  Procedure Laterality Date  . Coronary artery bypass graft  03/2001    "CABG X3" (11/15/2012)  . Cardiac catheterization  2012    "several in the last 12 years" (11/15/2012)  . Coronary angioplasty with stent  placement      "counting the 2 they put in today I've got 6 or 7 stents total" (11/15/2012)  . Tonsillectomy and adenoidectomy  ~ 1948  . Knee surgery Left ~ 2004    "shattered" (11/15/2012)    Family History  Problem Relation Age of Onset  . Coronary artery disease    . Heart attack Mother   . Heart failure Father   . Cancer Mother   . Heart failure Maternal Grandmother   . Diabetes Maternal Grandmother   . Kidney disease Paternal Grandfather    Social History:  reports that she quit smoking about 12 years ago. Her smoking use included Cigarettes. She has a 35 pack-year smoking history. She has quit using smokeless tobacco. Her smokeless tobacco use included Snuff. She reports that she does not drink alcohol or use illicit drugs.  Allergies:  Allergies  Allergen Reactions  . Sulfonamide Derivatives Rash and Other (See Comments)    Reaction: fever and rash     (Not in a hospital admission)  Results for orders placed during the hospital encounter of 09/14/13 (from the past 48 hour(s))  CBC WITH DIFFERENTIAL     Status: None   Collection Time    09/14/13  2:32 PM      Result Value Ref Range   WBC 5.6  4.0 - 10.5 K/uL   RBC 4.12  3.87 - 5.11 MIL/uL   Hemoglobin 12.7  12.0 - 15.0 g/dL   HCT 38.6  36.0 - 46.0 %   MCV 93.7  78.0 - 100.0 fL   MCH 30.8  26.0 - 34.0 pg   MCHC 32.9  30.0 - 36.0 g/dL   RDW 13.3  11.5 - 15.5 %   Platelets 195  150 - 400 K/uL   Neutrophils Relative % 59  43 - 77 %   Neutro Abs 3.4  1.7 - 7.7 K/uL   Lymphocytes Relative 28  12 - 46 %   Lymphs Abs 1.6  0.7 - 4.0 K/uL   Monocytes Relative 10  3 - 12 %   Monocytes Absolute 0.6  0.1 - 1.0 K/uL   Eosinophils Relative 2  0 - 5 %   Eosinophils Absolute 0.1  0.0 - 0.7 K/uL   Basophils Relative 1  0 - 1 %   Basophils Absolute 0.0  0.0 - 0.1 K/uL  COMPREHENSIVE METABOLIC PANEL     Status: Abnormal   Collection Time    09/14/13  2:32 PM      Result Value Ref Range   Sodium 142  137 - 147 mEq/L    Potassium 3.8  3.7 - 5.3 mEq/L   Chloride 102  96 - 112 mEq/L   CO2 26  19 - 32 mEq/L   Glucose, Bld 92  70 - 99 mg/dL   BUN 17  6 - 23 mg/dL   Creatinine, Ser 0.84  0.50 - 1.10 mg/dL   Calcium 9.4  8.4 - 10.5 mg/dL   Total Protein 7.2  6.0 - 8.3 g/dL   Albumin 3.8  3.5 - 5.2 g/dL   AST 16  0 - 37 U/L   ALT 14  0 - 35 U/L   Alkaline Phosphatase 42  39 - 117 U/L   Total Bilirubin 0.3  0.3 - 1.2 mg/dL   GFR calc non Af Amer 68 (*) >90 mL/min   GFR calc Af Amer 79 (*) >90 mL/min   Comment: (NOTE)     The eGFR has been calculated using the CKD EPI equation.     This calculation has not been validated in all clinical situations.     eGFR's persistently <90 mL/min signify possible Chronic Kidney     Disease.  Randolm Idol, ED     Status: None   Collection Time    09/14/13  2:47 PM      Result Value Ref Range   Troponin i, poc 0.00  0.00 - 0.08 ng/mL   Comment 3            Comment: Due to the release kinetics of cTnI,     a negative result within the first hours     of the onset of symptoms does not rule out     myocardial infarction with certainty.     If myocardial infarction is still suspected,     repeat the test at appropriate intervals.   Dg Chest 2 View  09/14/2013   CLINICAL DATA:  Chest pain, history of CABG, COPD, and hypertension  EXAM: CHEST  2 VIEW  COMPARISON:  DG CHEST 2 VIEW dated 04/17/2013; DG CHEST 1V PORT dated 09/26/2004  FINDINGS: The heart size and mediastinal contours are within normal limits. Post CABG changes. Tortuosity of the descending thoracic aorta. Stable mild hyperinflation bilaterally with scarring in the right mid lung. New subtle 5 mm nodule projecting over the anterior aspect of right second rib. No pleural effusion or pneumothorax. Both lungs are clear. The visualized skeletal structures are unremarkable.  IMPRESSION: Post CABG changes and stable scarring in the right mid lung. No CHF nor pneumonia. Subtle nodule in the right upper lobe not clearly  related to skeletal structures merits further evaluation with noncontrast CT scanning now.   Electronically Signed   By: David  Martinique   On: 09/14/2013 14:59    Review of Systems  Constitutional: Negative for fever and diaphoresis.  HENT: Negative for congestion and sore throat.   Respiratory: Positive for shortness of breath. Negative for cough.   Cardiovascular: Positive for chest pain. Negative for orthopnea, leg swelling and PND.  Gastrointestinal: Negative for nausea, vomiting, abdominal pain, blood in stool and melena.  Genitourinary: Negative for hematuria.  Musculoskeletal: Positive for myalgias.  Neurological: Positive for dizziness.  Psychiatric/Behavioral: Suicidal ideas: some radiation of chest pain into her left arm.  All other systems reviewed and are negative.   Blood pressure 127/63, pulse 57, temperature 98.1 F (36.7 C), temperature source Oral, resp. rate 18, SpO2 96.00%. Physical Exam  Nursing note and vitals reviewed. Constitutional: She is oriented to person, place, and time. She appears well-developed and well-nourished. No distress.  HENT:  Head: Normocephalic and atraumatic.  Mouth/Throat: Oropharynx  is clear and moist. No oropharyngeal exudate.  Eyes: EOM are normal. Pupils are equal, round, and reactive to light. No scleral icterus.  Neck: Normal range of motion. Neck supple.  Cardiovascular: Normal rate and regular rhythm.   No murmur heard. Respiratory: Effort normal and breath sounds normal. No respiratory distress. She has no wheezes. She has no rales.  GI: Soft. Bowel sounds are normal. She exhibits no distension. There is no tenderness.  Musculoskeletal: She exhibits no edema.  Lymphadenopathy:    She has no cervical adenopathy.  Neurological: She is alert and oriented to person, place, and time. She exhibits normal muscle tone.  Skin: Skin is warm and dry.  Psychiatric: She has a normal mood and affect.     Assessment/Plan Active Problems:    Hypertension   Hyperlipidemia, mixed   S/P CABG (coronary artery bypass graft)   CAD (coronary artery disease)   COPD (chronic obstructive pulmonary disease)   Chest pain   Plan:  Patient will be admitted to telemetry unit. She is currently pain-free.  Pain she had was nonexertional she did not have any pain with exertion.  We'll cycle troponin x3 every 6 hour. We'll add Protonix.  Continue home medications for blood pressure. Continue to monitor blood pressure. Lexa scan Myoview tomorrow morning. N.p.o. after midnight.  Given resent uncontrolled blood pressure, consider renal Dopplers.   Tarri Fuller 09/14/2013, 4:34 PM   Patient seen and examined with Tarri Fuller, PA-C. We discussed all aspects of the encounter. I agree with the assessment and plan as stated above.   CP with typical and atypical features but has now led to 3 ER visits this week. I have reviewed previous caths in 6/14 and 12/14 (no interval change). Will admit for rule out MI. If CE negative plan Lexiscan Myoview in am. Cath only if high risk. Agree with switching back to Benicar (from losartan) for her HTN.   Shaune Pascal Bensimhon,MD 5:26 PM

## 2013-09-14 NOTE — ED Notes (Signed)
Cardiology at bedside.

## 2013-09-14 NOTE — ED Provider Notes (Signed)
CSN: [id number]     Arrival date & time 09/14/13  1421 History   First MD Initiated Contact with Patient 09/14/13 1458     Chief Complaint  Patient presents with  . Chest Pain     (Consider location/radiation/quality/duration/timing/severity/associated sxs/prior Treatment) Patient is a 73 y.o. female presenting with chest pain.  Chest Pain Pain location:  Substernal area Pain quality: burning and pressure   Pain radiates to:  Does not radiate Pain radiates to the back: no   Pain severity:  Mild Onset quality:  Gradual Duration: 1 week. Timing:  Intermittent (minutes) Progression:  Worsening Chronicity:  New Context: at rest   Relieved by:  Nothing Worsened by:  Nothing tried Ineffective treatments:  Aspirin (nitro) Associated symptoms: dizziness (mild lightheadedness occasionally) and shortness of breath (mild)   Associated symptoms: no abdominal pain, no back pain, no cough, no fatigue, no fever, no headache, no nausea and not vomiting     Past Medical History  Diagnosis Date  . Hypertension     Unspecified   . Hyperlipidemia, mixed   . CAD (coronary artery disease)     a. s/p CABG x3v (2002); NSTEMI s/p PCI of the ostium of SVG to OM (2003); PCI with DES of SVG to OM (10/2012)   . Bradycardia   . PAD (peripheral artery disease)     a. s/p stent to right common iliac artery (2012)  . TIA (transient ischemic attack)   . Claudication   . Renal artery stenosis     95% stenosis  known from the past., Small left kidney, medical therapy  . Carotid artery disease     November, 2013  //   Doppler, March, 2014, stable, 0-39% bilateral, followup 2 years  . Myocardial infarction 11/15/2012  . Daily headache   . Shoulder pain, left    Past Surgical History  Procedure Laterality Date  . Coronary artery bypass graft  03/2001    "CABG X3" (11/15/2012)  . Cardiac catheterization  2012    "several in the last 12 years" (11/15/2012)  . Coronary angioplasty with stent placement       "counting the 2 they put in today I've got 6 or 7 stents total" (11/15/2012)  . Tonsillectomy and adenoidectomy  ~ 1948  . Knee surgery Left ~ 2004    "shattered" (11/15/2012)   Family History  Problem Relation Age of Onset  . Coronary artery disease    . Heart attack Mother   . Heart failure Father   . Cancer Mother   . Heart failure Maternal Grandmother   . Diabetes Maternal Grandmother   . Kidney disease Paternal Grandfather    History  Substance Use Topics  . Smoking status: Former Smoker -- 1.00 packs/day for 35 years    Types: Cigarettes    Quit date: 03/30/2001  . Smokeless tobacco: Former Neurosurgeon    Types: Snuff  . Alcohol Use: No   OB History   Grav Para Term Preterm Abortions TAB SAB Ect Mult Living                 Review of Systems  Constitutional: Negative for fever and fatigue.  HENT: Negative for congestion and drooling.   Eyes: Negative for pain.  Respiratory: Positive for shortness of breath (mild). Negative for cough.   Cardiovascular: Positive for chest pain (burning and pressure).  Gastrointestinal: Negative for nausea, vomiting, abdominal pain and diarrhea.  Genitourinary: Negative for dysuria and hematuria.  Musculoskeletal: Negative for back pain,  gait problem and neck pain.  Skin: Negative for color change.  Neurological: Positive for dizziness (mild lightheadedness occasionally). Negative for headaches.  Hematological: Negative for adenopathy.  Psychiatric/Behavioral: Negative for behavioral problems.  All other systems reviewed and are negative.     Allergies  Sulfonamide derivatives  Home Medications   Prior to Admission medications   Medication Sig Start Date End Date Taking? Authorizing Provider  amLODipine (NORVASC) 5 MG tablet Take 5 mg by mouth daily.     Yes Historical Provider, MD  aspirin 81 MG tablet Take 81 mg by mouth daily.     Yes Historical Provider, MD  atorvastatin (LIPITOR) 40 MG tablet Take 40 mg by mouth daily.   Yes  Historical Provider, MD  cloNIDine (CATAPRES) 0.1 MG tablet Take 0.1 mg by mouth daily as needed (for high BP).   Yes Historical Provider, MD  clopidogrel (PLAVIX) 75 MG tablet Take 1 tablet (75 mg total) by mouth daily. 11/16/12  Yes Rhonda G Barrett, PA-C  docusate sodium (COLACE) 100 MG capsule Take 200 mg by mouth at bedtime.    Yes Historical Provider, MD  gabapentin (NEURONTIN) 100 MG capsule Take 100 mg by mouth at bedtime.   Yes Historical Provider, MD  isosorbide mononitrate (IMDUR) 60 MG 24 hr tablet Take 60 mg by mouth daily.    Yes Historical Provider, MD  metoprolol (TOPROL-XL) 50 MG 24 hr tablet Take 75 mg by mouth daily.    Yes Historical Provider, MD  nitroGLYCERIN (NITROSTAT) 0.4 MG SL tablet Place 1 tablet (0.4 mg total) under the tongue every 5 (five) minutes as needed. For chest pain 11/16/12  Yes Rhonda G Barrett, PA-C  olmesartan (BENICAR) 40 MG tablet Take 40 mg by mouth daily.   Yes Historical Provider, MD  Omega-3 Fatty Acids (FISH OIL) 1000 MG CAPS Take 2 capsules (2,000 mg total) by mouth 2 (two) times daily. 04/18/13  Yes Thereasa ParkinKathryn Stern, PA-C   BP 127/63  Pulse 57  Temp(Src) 98.1 F (36.7 C) (Oral)  Resp 18  SpO2 96% Physical Exam  Nursing note and vitals reviewed. Constitutional: She is oriented to person, place, and time. She appears well-developed and well-nourished.  HENT:  Head: Normocephalic and atraumatic.  Mouth/Throat: Oropharynx is clear and moist. No oropharyngeal exudate.  Eyes: Conjunctivae and EOM are normal. Pupils are equal, round, and reactive to light.  Neck: Normal range of motion. Neck supple.  Cardiovascular: Normal rate, regular rhythm, normal heart sounds and intact distal pulses.  Exam reveals no gallop and no friction rub.   No murmur heard. Pulmonary/Chest: Effort normal and breath sounds normal. No respiratory distress. She has no wheezes.  Abdominal: Soft. Bowel sounds are normal. There is no tenderness. There is no rebound and no  guarding.  Musculoskeletal: Normal range of motion. She exhibits no edema and no tenderness.  Neurological: She is alert and oriented to person, place, and time.  Skin: Skin is warm and dry.  Psychiatric: She has a normal mood and affect. Her behavior is normal.    ED Course  Procedures (including critical care time) Labs Review Labs Reviewed  COMPREHENSIVE METABOLIC PANEL - Abnormal; Notable for the following:    GFR calc non Af Amer 68 (*)    GFR calc Af Amer 79 (*)    All other components within normal limits  CBC WITH DIFFERENTIAL  Rosezena SensorI-STAT TROPOININ, ED    Imaging Review Dg Chest 2 View  09/14/2013   CLINICAL DATA:  Chest pain, history of  CABG, COPD, and hypertension  EXAM: CHEST  2 VIEW  COMPARISON:  DG CHEST 2 VIEW dated 04/17/2013; DG CHEST 1V PORT dated 09/26/2004  FINDINGS: The heart size and mediastinal contours are within normal limits. Post CABG changes. Tortuosity of the descending thoracic aorta. Stable mild hyperinflation bilaterally with scarring in the right mid lung. New subtle 5 mm nodule projecting over the anterior aspect of right second rib. No pleural effusion or pneumothorax. Both lungs are clear. The visualized skeletal structures are unremarkable.  IMPRESSION: Post CABG changes and stable scarring in the right mid lung. No CHF nor pneumonia. Subtle nodule in the right upper lobe not clearly related to skeletal structures merits further evaluation with noncontrast CT scanning now.   Electronically Signed   By: David  [place]   On: 09/14/2013 14:59     EKG Interpretation   Date/Time:  Friday Sep 14 2013 14:25:49 EDT Ventricular Rate:  58 PR Interval:  194 QRS Duration: 92 QT Interval:  436 QTC Calculation: 428 R Axis:   110 Text Interpretation:  Sinus bradycardia with sinus arrhythmia Right axis  deviation non-spec t wave abnormality in V1-V2 No significant change since  last tracing Confirmed by Martise Waddell  MD, Darrian Goodwill ([id number]) on 09/14/2013  3:16:29 PM       MDM   Final diagnoses:  Chest pain  Hypertension    3:10 PM 73 y.o. female w hx of CAD s/p CABG (02), HTN, TIA who pw intermittent cp and elevated BP in the 160's-190's systolic over the last week. AFVSS here, appears well on exam. Pt seen at ED local to her house in [place] for the last 3 nights d/t cp and HTN.   Rads recommends CT w/out contrast d/t nodule on CXR. Will order.  Cards to admit.     Junius Argyle, MD 09/14/13 1750

## 2013-09-14 NOTE — Telephone Encounter (Signed)
New problem ° ° °Pt having chest pain. °

## 2013-09-14 NOTE — ED Notes (Signed)
Attempted report x 2 

## 2013-09-14 NOTE — ED Notes (Signed)
Pt transported to CT ?

## 2013-09-14 NOTE — ED Notes (Addendum)
Pt alsop reports she is having episodes of light headedness. Reports it feels like she has put on too strong px glasses. Neurologically intact.

## 2013-09-14 NOTE — ED Notes (Signed)
Patient transported to X-ray 

## 2013-09-14 NOTE — Telephone Encounter (Signed)
Pt called because she states has been out of town in [place] on vacation. About 3 weeks ago her BP has been 160 systolic Dr. Clelia Croft her PCP order  Clonidine , her bp came down to normal. Three night ago pt's BP was 190 systolic came down to 160 systolic and she was having a burning sensation and pressure in her chest. The same symptoms as before, it happened a few weeks before she had her heart attack on last July. Pt states she had a cardiac cath then. A few days ago  pt went to [facility] ER with burning sensation and pressure in her chest, NTG  SL given with no relief. Last night pt's BP 185 systolic. Pt took Clonidine as  recommended  per her PCP. Today pt still has a burning sensation and pressure in her chest . Her PCP recommended for her to [facility] ER. Pt states she is on her way about 2 hours from [place]. Pt was advice to go straight to  [facility] ER. Pt verbalized understanding. She states will let the nurse know to call the cardiologist on call in the hospital regarding pt.

## 2013-09-14 NOTE — ED Notes (Addendum)
Pt taking BP 3 times day in order to readjust her dose of BP meds; BP was 190/80s and took clonidine. Pt' pressure went down. Pt was in ER in [place], [place] and had full cardiac workup last night due to being awaken by pressure and burning sensation. Was told to follow up with cardiologist. Pt feels burning in chest and feels like it did last night. MI last July and pt reports this is what happened 2 weeks before hers. Large cardiac hx with cath. Dr. Myrtis Ser office told her to come here and call them.

## 2013-09-14 NOTE — ED Notes (Signed)
Pt back from CT

## 2013-09-15 ENCOUNTER — Observation Stay (HOSPITAL_COMMUNITY): Payer: Medicare Other

## 2013-09-15 DIAGNOSIS — Z951 Presence of aortocoronary bypass graft: Secondary | ICD-10-CM

## 2013-09-15 DIAGNOSIS — I2510 Atherosclerotic heart disease of native coronary artery without angina pectoris: Secondary | ICD-10-CM

## 2013-09-15 DIAGNOSIS — I498 Other specified cardiac arrhythmias: Secondary | ICD-10-CM

## 2013-09-15 DIAGNOSIS — R0989 Other specified symptoms and signs involving the circulatory and respiratory systems: Secondary | ICD-10-CM

## 2013-09-15 DIAGNOSIS — E782 Mixed hyperlipidemia: Secondary | ICD-10-CM

## 2013-09-15 DIAGNOSIS — I739 Peripheral vascular disease, unspecified: Secondary | ICD-10-CM

## 2013-09-15 DIAGNOSIS — I701 Atherosclerosis of renal artery: Secondary | ICD-10-CM

## 2013-09-15 LAB — LIPID PANEL
CHOLESTEROL: 124 mg/dL (ref 0–200)
HDL: 47 mg/dL (ref >39)
LDL Cholesterol: 60 mg/dL (ref 0–99)
Total CHOL/HDL Ratio: 2.6 RATIO
Triglycerides: 84 mg/dL (ref <150)
VLDL: 17 mg/dL (ref 0–40)

## 2013-09-15 LAB — BASIC METABOLIC PANEL
BUN: 18 mg/dL (ref 6–23)
CO2: 27 mEq/L (ref 19–32)
Calcium: 9 mg/dL (ref 8.4–10.5)
Chloride: 104 mEq/L (ref 96–112)
Creatinine, Ser: 0.98 mg/dL (ref 0.50–1.10)
GFR calc Af Amer: 66 mL/min — ABNORMAL LOW (ref >90)
GFR calc non Af Amer: 57 mL/min — ABNORMAL LOW (ref >90)
GLUCOSE: 104 mg/dL — AB (ref 70–99)
POTASSIUM: 4.3 meq/L (ref 3.7–5.3)
Sodium: 142 mEq/L (ref 137–147)

## 2013-09-15 LAB — TROPONIN I: Troponin I: <0.3 ng/mL (ref <0.30)

## 2013-09-15 LAB — HEMOGLOBIN A1C
HEMOGLOBIN A1C: 6 % — AB (ref <5.7)
Mean Plasma Glucose: 126 mg/dL — ABNORMAL HIGH (ref <117)

## 2013-09-15 MED ORDER — TECHNETIUM TC 99M SESTAMIBI - CARDIOLITE
30.0000 | Freq: Once | INTRAVENOUS | Status: AC | PRN
  Administered 2013-09-15: 12:00:00 30 via INTRAVENOUS

## 2013-09-15 MED ORDER — ASPIRIN 81 MG PO CHEW: 81.0000 mg | CHEWABLE_TABLET | Freq: Every day | ORAL | Status: DC

## 2013-09-15 MED ORDER — REGADENOSON 0.4 MG/5ML IV SOLN
INTRAVENOUS | Status: AC
  Administered 2013-09-15: 0.4 mg via INTRAVENOUS
  Filled 2013-09-15: qty 5

## 2013-09-15 MED ORDER — REGADENOSON 0.4 MG/5ML IV SOLN
0.4000 mg | Freq: Once | INTRAVENOUS | Status: AC
  Administered 2013-09-15: 0.4 mg via INTRAVENOUS
  Filled 2013-09-15: qty 5

## 2013-09-15 MED ORDER — TECHNETIUM TC 99M SESTAMIBI - CARDIOLITE
10.0000 | Freq: Once | INTRAVENOUS | Status: AC | PRN
  Administered 2013-09-15: 10 via INTRAVENOUS

## 2013-09-15 MED ORDER — PANTOPRAZOLE SODIUM 40 MG PO TBEC: 40.0000 mg | DELAYED_RELEASE_TABLET | Freq: Every day | ORAL | Status: DC

## 2013-09-15 NOTE — Discharge Summary (Signed)
Physician Discharge Summary  Patient ID: Linda [name] MRN: [id number] DOB/AGE: 1940-12-21 73 y.o.  Admit date: 09/14/2013 Discharge date: 09/15/2013  Primary Discharge Diagnosis 1.Chest Pain 2. Hyperrtension 3. CAD  Primary Cardiologist: Myrtis Ser  Significant Diagnostic Studies: 1. NM Lexiscan Study 09/15/2013 Negative for ischemia (per Dr. Allyson Sabal)  Hospital Course:       Linda [name] is a 73 year old female patient of Dr. Myrtis Ser with known history of CAD, CABG 2002, non-ST elevation MI with PCI using DES to the SVG to OM in July 2014, ongoing hypertension, hyperlipidemia, bradycardia, peripheral arterial disease at the right common iliac artery in 2002. The patient has also multiple other medical problems.    She presented to the emergency room with elevated blood pressure, and burning chest pain 8/10 intensity with radiation to the left arm with mild associated shortness of breath and dizziness. She presents to the emergency room on several occasions for recurrent chest discomfort. Normally seen in [place]. The patient was admitted to rule out cardiac etiology of recurrent angina, with known history of CAD and multiple interventions.    Cardiac enzymes were cycled and found to be normal. She was scheduled for [facility] study which was completed on 09/15/2013. During hospitalization she had recurrent burning in her mid chest, but no significant chest pain with radiation. Nuclear study was read by Dr. Nanetta Batty was found to be negative for pharmacologic stress-induced ischemia LVEF of 77%, with probable mild anteroapical attenuation.    The patient was placed on PPI. It was recommended by Dr. Allyson Sabal that the patient go home on current medication regimen with PPI. Follow up appt with Dr. Myrtis Ser, with renal artery ultrasound prior to the appt for re-evaluation for RAS with know history and need for intervention with difficult to control hypertension.  She was found to be stable for  cardiac standpoint.           Discharge Exam: Blood pressure 152/80, pulse 58, temperature 97.7 F (36.5 C), temperature source Oral, resp. rate 18, SpO2 94.00%.   Labs:   Lab Results  Component Value Date   WBC 5.6 09/14/2013   HGB 12.7 09/14/2013   HCT 38.6 09/14/2013   MCV 93.7 09/14/2013   PLT 195 09/14/2013     Recent Labs Lab 09/14/13 1432 09/15/13 0130  NA 142 142  K 3.8 4.3  CL 102 104  CO2 26 27  BUN 17 18  CREATININE 0.84 0.98  CALCIUM 9.4 9.0  PROT 7.2  --   BILITOT 0.3  --   ALKPHOS 42  --   ALT 14  --   AST 16  --   GLUCOSE 92 104*   Lab Results  Component Value Date   TROPONINI <0.30 09/15/2013    Lab Results  Component Value Date   CHOL 124 09/15/2013   CHOL 129 04/18/2013   Lab Results  Component Value Date   HDL 47 09/15/2013   HDL 42 73/53/2992   Lab Results  Component Value Date   LDLCALC 60 09/15/2013   LDLCALC 55 04/18/2013   Lab Results  Component Value Date   TRIG 84 09/15/2013   TRIG 162* 04/18/2013   Lab Results  Component Value Date   CHOLHDL 2.6 09/15/2013   CHOLHDL 3.1 04/18/2013   No results found for this basename: LDLDIRECT      Radiology: Dg Chest 2 View  09/14/2013   CLINICAL DATA:  Chest pain, history of CABG, COPD, and hypertension  EXAM: CHEST  2 VIEW  COMPARISON:  DG CHEST 2 VIEW dated 04/17/2013; DG CHEST 1V PORT dated 09/26/2004  FINDINGS: The heart size and mediastinal contours are within normal limits. Post CABG changes. Tortuosity of the descending thoracic aorta. Stable mild hyperinflation bilaterally with scarring in the right mid lung. New subtle 5 mm nodule projecting over the anterior aspect of right second rib. No pleural effusion or pneumothorax. Both lungs are clear. The visualized skeletal structures are unremarkable.  IMPRESSION: Post CABG changes and stable scarring in the right mid lung. No CHF nor pneumonia. Subtle nodule in the right upper lobe not clearly related to skeletal structures merits further  evaluation with noncontrast CT scanning now.   Electronically Signed   By: David  [place]   On: 09/14/2013 14:59   Ct Chest Wo Contrast  09/14/2013   CLINICAL DATA:  Chest pain.  Nodule noted on chest x-ray.  EXAM: CT CHEST WITHOUT CONTRAST  TECHNIQUE: Multidetector CT imaging of the chest was performed following the standard protocol without IV contrast.  COMPARISON:  Chest x-ray 09/14/2013  FINDINGS: Lungs are well inflated without focal consolidation or effusion. There is no evidence of right upper lobe nodule. There is minimal linear scarring over the right middle lobe and lingula. There is a subtle 3-4 mm nodule over the left upper lobe. There is very subtle increased peripheral interstitial markings over the right lung base. Heart is normal in size. Median sternotomy wires are present. There is a stent over the left anterior descending coronary artery. There is moderate calcified plaque over the thoracic aorta. There is no hilar, mediastinal or axillary adenopathy. There is a sub cm right pericardiophrenic lymph node.  Images through the upper abdomen are unremarkable. There is minimal degenerative change of the spine.  IMPRESSION: No acute cardiopulmonary disease. No evidence of nodule over the right upper lobe. Minimal linear scarring over the right middle lobe and lingula. Subtle peripheral increased interstitial markings over the right base.  Prior median sternotomy and coronary stent placement.   Electronically Signed   By: Elberta Fortis M.D.   On: 09/14/2013 17:37   Nm Myocar Multi W/spect W/wall Motion / Ef  09/15/2013   CLINICAL DATA:  Chest pain, syncope  EXAM: NUCLEAR MEDICINE MYOCARDIAL PERFUSION IMAGING  NUCLEAR MEDICINE LEFT VENTRICULAR WALL MOTION ANALYSIS  NUCLEAR MEDICINE LEFT VENTRICULAR EJECTION FRACTION CALCULATION  TECHNIQUE: Standard single day myocardial SPECT imaging was performed after resting intravenous injection of Tc-65m sestamibi. After intravenous infusion of Lexiscan  (regadenoson) under supervision of cardiology staff, sestamibiwas injected intravenously and standard myocardial SPECT imaging was performed. Quantitative gated imaging was also performed to evaluate left ventricular wall motion and estimate left ventricular ejection fraction.  Radiopharmaceutical: 10+30 mCi Tc24m sestamibiIV.  COMPARISON:  None  FINDINGS: The stress SPECT images demonstrate small area of mildly decreased activity in the anteroapical region of the left ventricle, otherwise physiologic distribution of radiopharmaceutical. Rest images demonstrate similar decrease in anteroapical activity, no new perfusion defects. The gated stress SPECT images demonstrate normal left ventricular myocardial thickening. No focal wall motion abnormality is seen. Calculated left ventricular end-diastolic volume 46ml, end-systolic volume 45ml, ejection fraction of 77%.  IMPRESSION: 1. Negative for pharmacologic-stress induced ischemia. 2. Left ventricular ejection fraction 77%. 3. Probable mild  anteroapical attenuation.  The study was reviewed with Dr. Erlene Quan at the time interpretation.   Electronically Signed   By: Oley Balm M.D.   On: 09/15/2013 13:52    FOLLOW UP PLANS AND APPOINTMENTS  Discharge Instructions   Diet - low sodium heart healthy    Complete by:  As directed      Increase activity slowly    Complete by:  As directed             Medication List         amLODipine 5 MG tablet  Commonly known as:  NORVASC  Take 5 mg by mouth daily.     aspirin 81 MG tablet  Take 81 mg by mouth daily.     aspirin 81 MG chewable tablet  Chew 1 tablet (81 mg total) by mouth daily.     atorvastatin 40 MG tablet  Commonly known as:  LIPITOR  Take 40 mg by mouth daily.     cloNIDine 0.1 MG tablet  Commonly known as:  CATAPRES  Take 0.1 mg by mouth daily as needed (for high BP).     clopidogrel 75 MG tablet  Commonly known as:  PLAVIX  Take 1 tablet (75 mg total) by mouth daily.      docusate sodium 100 MG capsule  Commonly known as:  COLACE  Take 200 mg by mouth at bedtime.     Fish Oil 1000 MG Caps  Take 2 capsules (2,000 mg total) by mouth 2 (two) times daily.     gabapentin 100 MG capsule  Commonly known as:  NEURONTIN  Take 100 mg by mouth at bedtime.     isosorbide mononitrate 60 MG 24 hr tablet  Commonly known as:  IMDUR  Take 60 mg by mouth daily.     metoprolol succinate 50 MG 24 hr tablet  Commonly known as:  TOPROL-XL  Take 75 mg by mouth daily.     nitroGLYCERIN 0.4 MG SL tablet  Commonly known as:  NITROSTAT  Place 1 tablet (0.4 mg total) under the tongue every 5 (five) minutes as needed. For chest pain     olmesartan 40 MG tablet  Commonly known as:  BENICAR  Take 40 mg by mouth daily.     pantoprazole 40 MG tablet  Commonly known as:  PROTONIX  Take 1 tablet (40 mg total) by mouth daily at 6 (six) AM.       Follow-up Information   Follow up with Willa RoughJeffrey Katz, MD. (Our office will call you for appoitment)    Specialty:  Cardiology   Contact information:   [address] [place] [address] [phone]       Follow [facility] with [facility] (Renal Artery Ultrasound in the office. Our office will call you for appt to schedule this.Then follow up with Dr. Myrtis SerKatz to discuss)    Specialty:  Cardiology   Contact information:   [address], [address] [place] [address] [phone]        Time spent with patient to include physician time:35 minutues. Signed: Jodelle GrossKathryn M Andree Golphin 09/15/2013, 2:08 PM Co-Sign MD

## 2013-09-15 NOTE — Progress Notes (Signed)
Consulting cardiologist: Bensimhon Primary Cardiologist: Myrtis Ser  Subjective:    Some burning in her chest overnight. Light burning this morning. (Patient assessed in Stress Lab.   Objective:   Temp:  [97.4 F (36.3 C)-98.1 F (36.7 C)] 97.4 F (36.3 C) (05/23 0510) Pulse Rate:  [46-57] 46 (05/23 0510) Resp:  [18] 18 (05/23 0510) BP: (122-140)/(55-63) 140/59 mmHg (05/23 1149) SpO2:  [94 %-98 %] 98 % (05/23 0510) Last BM Date: 09/13/13  There were no vitals filed for this visit.  Intake/Output Summary (Last 24 hours) at 09/15/13 1205 Last data filed at 09/14/13 2037  Gross per 24 hour  Intake    240 ml  Output      0 ml  Net    240 ml    Telemetry: NSR  Exam:  General: No acute distress.  HEENT: Conjunctiva and lids normal, oropharynx clear.  Lungs: Clear to auscultation, nonlabored.  Cardiac: No elevated JVP or bruits. RRR, no gallop or rub.   Abdomen: Normoactive bowel sounds, nontender, nondistended.  Extremities: No pitting edema, distal pulses full.  Neuropsychiatric: Alert and oriented x3, affect appropriate.   Lab Results:  Basic Metabolic Panel:  Recent Labs Lab 09/14/13 1432 09/15/13 0130  NA 142 142  K 3.8 4.3  CL 102 104  CO2 26 27  GLUCOSE 92 104*  BUN 17 18  CREATININE 0.84 0.98  CALCIUM 9.4 9.0    Liver Function Tests:  Recent Labs Lab 09/14/13 1432  AST 16  ALT 14  ALKPHOS 42  BILITOT 0.3  PROT 7.2  ALBUMIN 3.8    CBC:  Recent Labs Lab 09/14/13 1432  WBC 5.6  HGB 12.7  HCT 38.6  MCV 93.7  PLT 195    Cardiac Enzymes:  Recent Labs Lab 09/14/13 1850 09/15/13 0130 09/15/13 0725  TROPONINI <0.30 <0.30 <0.30    BNP: No results found for this basename: PROBNP,  in the last 8760 hours  Coagulation: No results found for this basename: INR,  in the last 168 hours  Radiology: Dg Chest 2 View  09/14/2013   CLINICAL DATA:  Chest pain, history of CABG, COPD, and hypertension  EXAM: CHEST  2 VIEW   COMPARISON:  DG CHEST 2 VIEW dated 04/17/2013; DG CHEST 1V PORT dated 09/26/2004  FINDINGS: The heart size and mediastinal contours are within normal limits. Post CABG changes. Tortuosity of the descending thoracic aorta. Stable mild hyperinflation bilaterally with scarring in the right mid lung. New subtle 5 mm nodule projecting over the anterior aspect of right second rib. No pleural effusion or pneumothorax. Both lungs are clear. The visualized skeletal structures are unremarkable.  IMPRESSION: Post CABG changes and stable scarring in the right mid lung. No CHF nor pneumonia. Subtle nodule in the right upper lobe not clearly related to skeletal structures merits further evaluation with noncontrast CT scanning now.   Electronically Signed   By: David  [place]   On: 09/14/2013 14:59   Ct Chest Wo Contrast  09/14/2013   CLINICAL DATA:  Chest pain.  Nodule noted on chest x-ray.  EXAM: CT CHEST WITHOUT CONTRAST  TECHNIQUE: Multidetector CT imaging of the chest was performed following the standard protocol without IV contrast.  COMPARISON:  Chest x-ray 09/14/2013  FINDINGS: Lungs are well inflated without focal consolidation or effusion. There is no evidence of right upper lobe nodule. There is minimal linear scarring over the right middle lobe and lingula. There is a subtle 3-4 mm nodule over the left upper lobe.  There is very subtle increased peripheral interstitial markings over the right lung base. Heart is normal in size. Median sternotomy wires are present. There is a stent over the left anterior descending coronary artery. There is moderate calcified plaque over the thoracic aorta. There is no hilar, mediastinal or axillary adenopathy. There is a sub cm right pericardiophrenic lymph node.  Images through the upper abdomen are unremarkable. There is minimal degenerative change of the spine.  IMPRESSION: No acute cardiopulmonary disease. No evidence of nodule over the right upper lobe. Minimal linear scarring over  the right middle lobe and lingula. Subtle peripheral increased interstitial markings over the right base.  Prior median sternotomy and coronary stent placement.   Electronically Signed   By: Elberta Fortisaniel  Boyle M.D.   On: 09/14/2013 17:37      Medications:   Scheduled Medications: . amLODipine  5 mg Oral Daily  . aspirin  81 mg Oral Daily  . atorvastatin  40 mg Oral Daily  . clopidogrel  75 mg Oral Daily  . docusate sodium  200 mg Oral QHS  . gabapentin  100 mg Oral QHS  . irbesartan  300 mg Oral Daily  . isosorbide mononitrate  60 mg Oral Daily  . metoprolol succinate  75 mg Oral Daily  . omega-3 acid ethyl esters  1 g Oral BID  . pantoprazole  40 mg Oral Q0600  . regadenoson      . regadenoson  0.4 mg Intravenous Once      PRN Medications: cloNIDine, nitroGLYCERIN, technetium sestamibi   Assessment and Plan:   1. Chest pain with known CAD: Burning feeling overnight, and mild this am. No radiation or problems with dyspnea. Cardiac enzymes are negative arguing against ACS. Lexiscan Cardiolite completed with no acute ST T wave abnormalities. Nuclear scintigraphy follows. Could go home if negative.PPI continues. Continue Plavix and ASA, BB.   2. Hypertension; Now back on benicar and amlodipine. Peri [place]. Good control.   3. Hyperlipidemia: Continue statin.    Bettey MareKathryn M. Lyman BishopLawrence NP [facility] 09/15/2013, 12:05 PM  Agree with note by Joni ReiningKathryn Lawrence PA-C  Myoview nl (prob breast attenuation). Enz neg. No further CP. Exam benign. Has difficult to control HTN and known RAS. OK for DC home. ROV with Dr. Myrtis SerKatz. Will get renal dopplers in our office as well. Might benefit from renal artery stenting for better BP control on 3 antihypertensive meds.  Runell GessJonathan J. Berry, M.D., FACP, [facility] ServicesFACC, Earl LagosFAHA, [facility] Hospital[facility]SCAI [facility] [address] [place], [place]  [address]  [phone] 09/15/2013 1:39 PM

## 2013-09-24 ENCOUNTER — Encounter (HOSPITAL_COMMUNITY): Payer: Medicare Other

## 2013-09-25 ENCOUNTER — Telehealth: Payer: Self-pay | Admitting: Cardiology

## 2013-09-25 NOTE — Telephone Encounter (Signed)
Patient called today stating that she is concerned about her BP . States that last evening it was 183/80 at 8pm last night. States that she took a Clonidine and it brought it down. Today it is 150/72.  Linda [name] wants Dr. Myrtis Ser to know what is going on with her. She states that sometimes when Her BP is up she has a tingling sensation in her fingers.

## 2013-09-25 NOTE — Telephone Encounter (Signed)
PCP has been trying to get BP down for past 5 weeks. Patient seen in [facility] [place] ED 2 weeks ago for elevated BP. Patient was also seen recently at [facility] ED for elevated BP and was scheduled for f/u with Dr. Myrtis Ser and renal u/s with that visit. Patient is calling today to see if her appointment for the u/s can be sooner. Nurse advised patient that the [facility]. Office would be contacted and we would call her back. Nurse advised to continue the same medication therapy for her BP in the meantime.

## 2013-09-26 ENCOUNTER — Other Ambulatory Visit: Payer: Self-pay

## 2013-09-26 DIAGNOSIS — I701 Atherosclerosis of renal artery: Secondary | ICD-10-CM

## 2013-09-26 NOTE — Telephone Encounter (Signed)
Patient called to f/u on the appt for renal u/s and f/u with Myrtis Ser. Nurse advised her that the [facility] was unable to get an appointment for her u/s this week and that she should keep the same plan for her u/s and ov on 10/05/13 with Myrtis Ser at the [facility]. Office. Patient informed to continue her same medication plan. Patient verbalized understanding of plan.

## 2013-09-28 ENCOUNTER — Encounter: Payer: Self-pay | Admitting: Cardiology

## 2013-09-28 ENCOUNTER — Other Ambulatory Visit (HOSPITAL_COMMUNITY): Payer: Self-pay | Admitting: Cardiology

## 2013-09-28 ENCOUNTER — Ambulatory Visit (INDEPENDENT_AMBULATORY_CARE_PROVIDER_SITE_OTHER): Payer: Medicare Other | Admitting: Cardiology

## 2013-09-28 VITALS — BP 135/76 | HR 60 | Ht 61.0 in | Wt 141.8 lb

## 2013-09-28 DIAGNOSIS — I2510 Atherosclerotic heart disease of native coronary artery without angina pectoris: Secondary | ICD-10-CM

## 2013-09-28 DIAGNOSIS — I739 Peripheral vascular disease, unspecified: Secondary | ICD-10-CM

## 2013-09-28 DIAGNOSIS — R0989 Other specified symptoms and signs involving the circulatory and respiratory systems: Secondary | ICD-10-CM

## 2013-09-28 DIAGNOSIS — I701 Atherosclerosis of renal artery: Secondary | ICD-10-CM

## 2013-09-28 DIAGNOSIS — I10 Essential (primary) hypertension: Secondary | ICD-10-CM

## 2013-09-28 DIAGNOSIS — E782 Mixed hyperlipidemia: Secondary | ICD-10-CM

## 2013-09-28 DIAGNOSIS — I779 Disorder of arteries and arterioles, unspecified: Secondary | ICD-10-CM

## 2013-09-28 DIAGNOSIS — R9430 Abnormal result of cardiovascular function study, unspecified: Secondary | ICD-10-CM

## 2013-09-28 MED ORDER — HYDROCHLOROTHIAZIDE 12.5 MG PO CAPS: 12.5000 mg | ORAL_CAPSULE | Freq: Every day | ORAL | Status: DC

## 2013-09-28 NOTE — Assessment & Plan Note (Signed)
She has mild carotid disease that is being followed carefully.  As part of today's evaluation I spent greater than 25 minutes on her total care. More than half of this time was with a long discussion with her in person about her blood pressure in the approach is that we will take. I will see her back next week.

## 2013-09-28 NOTE — Assessment & Plan Note (Signed)
The patient has been quite concerned about the variation in her blood pressure. She is upset because she cannot understand why there is variation. I mentioned above that her blood pressure may be responsive to volume control even though she is not showing signs of congestive heart failure. She is on 5 mg of amlodipine. I've chosen not to change this today. She's on 40 mg of Benicar. She will remain on this. I've encouraged her not to use the clonidine she was given. She has taken only a few when necessary doses. I started 12.5 mg of hydrochlorothiazide. She is careful about her salt and fluid intake. I encouraged her to go about full activities. I encouraged her to take her blood pressure no more than twice daily. I also encouraged her not to be concerned if her pressure is in the range of 180 for short periods of time. There is a history of renal artery stenosis with a small kidney in the past. We will assess her further with renal artery Dopplers. I will then confer with Dr. Kirke Corin to see if he feels that her renal artery should be studied further. He knows her vascular status quite well.

## 2013-09-28 NOTE — Assessment & Plan Note (Signed)
She is receiving guidelines directed therapy. No change in therapy today.

## 2013-09-28 NOTE — Assessment & Plan Note (Signed)
The patient's claudication has been nicely treated by Dr. Kirke Corin. I will ask for help from him if it is felt that we should assess her renal arteries further.

## 2013-09-28 NOTE — Patient Instructions (Signed)
   Begin HCTZ 12.5mg  daily - new sent to pharm Continue all other medications.   Follow up [facility] office

## 2013-09-28 NOTE — Assessment & Plan Note (Signed)
Coronary disease is stable. Her last intervention was July, 2014. Catheterization in December, 2014 revealed no change. Nuclear study recently in the hospital in May, 2015 revealed no ischemia. No further workup at this time.

## 2013-09-28 NOTE — Progress Notes (Signed)
Patient ID: Linda [name], female   DOB: 10/31/1940, 73 y.o.   MRN: [id number]    HPI  The patient is seen to followup coronary disease and hypertension. I saw her last in the office February, 2015. She was stable then. She was post hospitalization from December, 2014. At that time she had undergone cardiac catheterization. Her coronaries were stable. She then saw Dr. Kirke CorinArida for vascular followup in March, 2015. She was stable at that visit. Recently she was bothered by increased blood pressure. She became quite concerned about this and eventually came to [facility]. She was admitted and ischemia was ruled out. She actually had a nuclear scan revealing no ischemia and a normal ejection fraction. She was placed on a PPI. Her major concern was her blood pressure. She was discharged home with plans to have a renal artery ultrasound and followup with me next week. Her blood pressure has ranged from a systolic of 180 to a systolic of 120. She was placed back on Benicar after losartan was stopped. She was also given some additional clonidine to use on a when necessary basis.  Historically her blood pressure has been under reasonable control. She is quite concerned about the variation in her pressure recently. Since been home from the hospital her pressure actually was in the 120 systolic range during a three-day period when she had a mild viral illness and had decreased oral intake. This raises the question that her blood pressure may be volume dependent. Allergies  Allergen Reactions  . Sulfonamide Derivatives Rash and Other (See Comments)    Reaction: fever and rash    Current Outpatient Prescriptions  Medication Sig Dispense Refill  . amLODipine (NORVASC) 5 MG tablet Take 5 mg by mouth daily.        Marland Kitchen. aspirin 81 MG tablet Take 81 mg by mouth daily.        Marland Kitchen. atorvastatin (LIPITOR) 40 MG tablet Take 40 mg by mouth daily.      . cloNIDine (CATAPRES) 0.1 MG tablet Take 0.1 mg by mouth daily as needed  (for high BP).      Marland Kitchen. clopidogrel (PLAVIX) 75 MG tablet Take 1 tablet (75 mg total) by mouth daily.  30 tablet  11  . docusate sodium (COLACE) 100 MG capsule Take 200 mg by mouth at bedtime.       . gabapentin (NEURONTIN) 100 MG capsule Take 100 mg by mouth at bedtime.      . isosorbide mononitrate (IMDUR) 60 MG 24 hr tablet Take 60 mg by mouth daily.       . metoprolol (TOPROL-XL) 50 MG 24 hr tablet Take 75 mg by mouth daily.       . nitroGLYCERIN (NITROSTAT) 0.4 MG SL tablet Place 1 tablet (0.4 mg total) under the tongue every 5 (five) minutes as needed. For chest pain  25 tablet  12  . olmesartan (BENICAR) 40 MG tablet Take 40 mg by mouth daily.      . Omega-3 Fatty Acids (FISH OIL) 1000 MG CAPS Take 2 capsules (2,000 mg total) by mouth 2 (two) times daily.       No current facility-administered medications for this visit.    History   Social History  . Marital Status: Married    Spouse Name: N/A    Number of Children: N/A  . Years of Education: N/A   Occupational History  . Retired    Social History Main Topics  . Smoking status: Former Smoker --  1.00 packs/day for 35 years    Types: Cigarettes    Quit date: 03/30/2001  . Smokeless tobacco: Former Neurosurgeon    Types: Snuff  . Alcohol Use: No  . Drug Use: No  . Sexual Activity: No   Other Topics Concern  . Not on file   Social History Narrative   She lives is [place] with husband and has two biological children.      Family History  Problem Relation Age of Onset  . Coronary artery disease    . Heart attack Mother   . Heart failure Father   . Cancer Mother   . Heart failure Maternal Grandmother   . Diabetes Maternal Grandmother   . Kidney disease Paternal Grandfather     Past Medical History  Diagnosis Date  . Hypertension     Unspecified   . Hyperlipidemia, mixed   . CAD (coronary artery disease)     a. s/p CABG x3v (2002); NSTEMI s/p PCI of the ostium of SVG to OM (2003); PCI with DES of SVG to OM (10/2012)     . Bradycardia   . PAD (peripheral artery disease)     a. s/p stent to right common iliac artery (2012)  . TIA (transient ischemic attack) 1990's X 2-3  . Claudication   . Renal artery stenosis     95% stenosis  known from the past., Small left kidney, medical therapy  . Carotid artery disease     November, 2013  //   Doppler, March, 2014, stable, 0-39% bilateral, followup 2 years  . Shoulder pain, left   . Myocardial infarction 11/15/2012  . Daily headache     "stopped when I had my cataract fixed earlier this year" (09/14/2013)    Past Surgical History  Procedure Laterality Date  . Coronary artery bypass graft  03/2001    "CABG X3"   . Cardiac catheterization      "several; last one was 04/18/2013" (09/14/2013)  . Coronary angioplasty with stent placement      "counting the 2 they put in 10/2012 I've got 6 or 7 stents total"  . Knee surgery Left ~ 2004    "shattered; left me w/ 1/2 of a kneecap"   . Tonsillectomy and adenoidectomy  ~ 1948  . Cataract extraction w/ intraocular lens  implant, bilateral Bilateral 2015  . Iliac artery stent Right 2012  . Dilation and curettage of uterus  1964    S/P miscarriage    Patient Active Problem List   Diagnosis Date Noted  . Shoulder pain, left   . Chest pain 04/17/2013  . Bilateral leg edema 03/12/2013  . Carotid artery disease   . PAD (peripheral artery disease)   . Renal artery stenosis   . Claudication   . Tachycardia   . Hypertension   . Hyperlipidemia, mixed   . S/P CABG (coronary artery bypass graft)   . CAD (coronary artery disease)   . Bradycardia   . COPD (chronic obstructive pulmonary disease)   . TIA (transient ischemic attack)   . Hematoma   . Ejection fraction     ROS   patient denies fever, chills, headache, sweats, rash, change in vision, change in hearing, chest pain, cough, nausea vomiting, urinary symptoms. She's had a vague sensation of dizziness. There's been no true vertigo.   PHYSICAL EXAM  PATIENT  IS ORIENTED TO PERSON TIME AND PLACE. AFFECT IS NORMAL. HEAD IS ATRAUMATIC. SCLERA AND CONJUNCTIVA ARE NORMAL. THERE IS NO JUGULOVENOUS DISTENTION.  LUNGS ARE CLEAR. RESPIRATORY EFFORT IS NOT LABORED. HER NECK EXAM REVEALS S1 AND S2. THE ABDOMEN IS SOFT. THERE MAY BE TRACE PERIPHERAL EDEMA. THERE ARE NO MUSCULOSKELETAL DEFORMITIES. THERE ARE NO SKIN RASHES.   Filed Vitals:   09/28/13 1544  BP: 135/76  Pulse: 60  Height: 5\' 1"  (1.549 m)  Weight: 141 lb 12.8 oz (64.32 kg)     ASSESSMENT & PLAN

## 2013-10-05 ENCOUNTER — Ambulatory Visit (HOSPITAL_COMMUNITY): Payer: Medicare Other | Attending: Cardiovascular Disease | Admitting: Cardiology

## 2013-10-05 ENCOUNTER — Ambulatory Visit: Payer: Medicare Other | Admitting: Cardiology

## 2013-10-05 ENCOUNTER — Other Ambulatory Visit: Payer: Self-pay

## 2013-10-05 ENCOUNTER — Ambulatory Visit (INDEPENDENT_AMBULATORY_CARE_PROVIDER_SITE_OTHER): Payer: Medicare Other | Admitting: Cardiology

## 2013-10-05 ENCOUNTER — Encounter: Payer: Self-pay | Admitting: Cardiology

## 2013-10-05 VITALS — BP 125/66 | HR 61 | Ht 61.0 in | Wt 141.8 lb

## 2013-10-05 DIAGNOSIS — Z87891 Personal history of nicotine dependence: Secondary | ICD-10-CM | POA: Insufficient documentation

## 2013-10-05 DIAGNOSIS — E785 Hyperlipidemia, unspecified: Secondary | ICD-10-CM | POA: Insufficient documentation

## 2013-10-05 DIAGNOSIS — I10 Essential (primary) hypertension: Secondary | ICD-10-CM

## 2013-10-05 DIAGNOSIS — R600 Localized edema: Secondary | ICD-10-CM

## 2013-10-05 DIAGNOSIS — Z8673 Personal history of transient ischemic attack (TIA), and cerebral infarction without residual deficits: Secondary | ICD-10-CM | POA: Insufficient documentation

## 2013-10-05 DIAGNOSIS — I701 Atherosclerosis of renal artery: Secondary | ICD-10-CM

## 2013-10-05 DIAGNOSIS — R609 Edema, unspecified: Secondary | ICD-10-CM

## 2013-10-05 DIAGNOSIS — I739 Peripheral vascular disease, unspecified: Secondary | ICD-10-CM

## 2013-10-05 DIAGNOSIS — Z951 Presence of aortocoronary bypass graft: Secondary | ICD-10-CM | POA: Insufficient documentation

## 2013-10-05 DIAGNOSIS — J4489 Other specified chronic obstructive pulmonary disease: Secondary | ICD-10-CM | POA: Insufficient documentation

## 2013-10-05 DIAGNOSIS — I2510 Atherosclerotic heart disease of native coronary artery without angina pectoris: Secondary | ICD-10-CM

## 2013-10-05 DIAGNOSIS — J449 Chronic obstructive pulmonary disease, unspecified: Secondary | ICD-10-CM | POA: Insufficient documentation

## 2013-10-05 DIAGNOSIS — E782 Mixed hyperlipidemia: Secondary | ICD-10-CM

## 2013-10-05 NOTE — Assessment & Plan Note (Signed)
She has some venous insufficiency. She gets edema when her legs are dependent. She noticed acute him elevated when she can.

## 2013-10-05 NOTE — Assessment & Plan Note (Signed)
Her PAD is followed very carefully by Dr. Kirke CorinArida.

## 2013-10-05 NOTE — Assessment & Plan Note (Signed)
She is receiving guidelines directed statin therapy.

## 2013-10-05 NOTE — Patient Instructions (Signed)
**Note De-Identified Annalese Stiner Obfuscation** Your physician recommends that you continue on your current medications as directed. Please refer to the Current Medication list given to you today.  Your physician wants you to follow-up in: 6 months in our [place] office. You will receive a reminder letter in the mail two months in advance. If you don't receive a letter, please call our office to schedule the follow-up appointment.

## 2013-10-05 NOTE — Progress Notes (Signed)
Renal artery duplex performed  

## 2013-10-05 NOTE — Progress Notes (Signed)
Patient ID: Carolin Sicksnne Gail Slusher, female   DOB: 05/22/1940, 73 y.o.   MRN: [id number]    HPI  Patient is seen today to followup coronary disease and hypertension. I saw her last in the office September 28, 2013. At that time she had some variation in her blood pressure. Decision was made to do renal artery Doppler today. I also added 12.5 mg of hydrochlorothiazide. She had been taking an intermittent dosing of clonidine that was stopped. Since that time her pressure has been in the range of 140 systolic. She's feeling well. She had a renal artery Doppler today. She is known well also to Dr. Kirke CorinArida. He had commented in a note to me that we knew that she would have decreased flow to her left kidney. This was old. The issue would be flow to her right kidney. The Doppler today shows only mild narrowing of her right renal artery.  Allergies  Allergen Reactions  . Sulfonamide Derivatives Rash and Other (See Comments)    Reaction: fever and rash    Current Outpatient Prescriptions  Medication Sig Dispense Refill  . amLODipine (NORVASC) 5 MG tablet Take 5 mg by mouth daily.        Marland Kitchen. aspirin 81 MG tablet Take 81 mg by mouth daily.        Marland Kitchen. atorvastatin (LIPITOR) 40 MG tablet Take 40 mg by mouth daily.      . cloNIDine (CATAPRES) 0.1 MG tablet Take 0.1 mg by mouth daily as needed (for high BP).      Marland Kitchen. clopidogrel (PLAVIX) 75 MG tablet Take 1 tablet (75 mg total) by mouth daily.  30 tablet  11  . docusate sodium (COLACE) 100 MG capsule Take 200 mg by mouth at bedtime.       . gabapentin (NEURONTIN) 100 MG capsule Take 100 mg by mouth at bedtime.      . hydrochlorothiazide (MICROZIDE) 12.5 MG capsule Take 1 capsule (12.5 mg total) by mouth daily.  30 capsule  6  . isosorbide mononitrate (IMDUR) 60 MG 24 hr tablet Take 60 mg by mouth daily.       . metoprolol (TOPROL-XL) 50 MG 24 hr tablet Take 75 mg by mouth daily.       . nitroGLYCERIN (NITROSTAT) 0.4 MG SL tablet Place 1 tablet (0.4 mg total) under the tongue  every 5 (five) minutes as needed. For chest pain  25 tablet  12  . olmesartan (BENICAR) 40 MG tablet Take 40 mg by mouth daily.      . Omega-3 Fatty Acids (FISH OIL) 1000 MG CAPS Take 2 capsules (2,000 mg total) by mouth 2 (two) times daily.       No current facility-administered medications for this visit.    History   Social History  . Marital Status: Married    Spouse Name: N/A    Number of Children: N/A  . Years of Education: N/A   Occupational History  . Retired    Social History Main Topics  . Smoking status: Former Smoker -- 1.00 packs/day for 35 years    Types: Cigarettes    Quit date: 03/30/2001  . Smokeless tobacco: Former NeurosurgeonUser    Types: Snuff  . Alcohol Use: No  . Drug Use: No  . Sexual Activity: No   Other Topics Concern  . Not on file   Social History Narrative   She lives is [place] with husband and has two biological children.      Family History  Problem Relation Age of Onset  . Coronary artery disease    . Heart attack Mother   . Heart failure Father   . Cancer Mother   . Heart failure Maternal Grandmother   . Diabetes Maternal Grandmother   . Kidney disease Paternal Grandfather     Past Medical History  Diagnosis Date  . Hypertension     Unspecified   . Hyperlipidemia, mixed   . CAD (coronary artery disease)     a. s/p CABG x3v (2002); NSTEMI s/p PCI of the ostium of SVG to OM (2003); PCI with DES of SVG to OM (10/2012)   . Bradycardia   . PAD (peripheral artery disease)     a. s/p stent to right common iliac artery (2012)  . TIA (transient ischemic attack) 1990's X 2-3  . Claudication   . Renal artery stenosis     95% stenosis  known from the past., Small left kidney, medical therapy  . Carotid artery disease     November, 2013  //   Doppler, March, 2014, stable, 0-39% bilateral, followup 2 years  . Shoulder pain, left   . Myocardial infarction 11/15/2012  . Daily headache     "stopped when I had my cataract fixed earlier this year"  (09/14/2013)    Past Surgical History  Procedure Laterality Date  . Coronary artery bypass graft  03/2001    "CABG X3"   . Cardiac catheterization      "several; last one was 04/18/2013" (09/14/2013)  . Coronary angioplasty with stent placement      "counting the 2 they put in 10/2012 I've got 6 or 7 stents total"  . Knee surgery Left ~ 2004    "shattered; left me w/ 1/2 of a kneecap"   . Tonsillectomy and adenoidectomy  ~ 1948  . Cataract extraction w/ intraocular lens  implant, bilateral Bilateral 2015  . Iliac artery stent Right 2012  . Dilation and curettage of uterus  1964    S/P miscarriage    Patient Active Problem List   Diagnosis Date Noted  . Shoulder pain, left   . Bilateral leg edema 03/12/2013  . Carotid artery disease   . PAD (peripheral artery disease)   . Renal artery stenosis   . Claudication   . Tachycardia   . Hypertension   . Hyperlipidemia, mixed   . S/P CABG (coronary artery bypass graft)   . CAD (coronary artery disease)   . Bradycardia   . COPD (chronic obstructive pulmonary disease)   . TIA (transient ischemic attack)   . Hematoma   . Ejection fraction     ROS   Patient denies fever, chills, headache, sweats, rash, change in vision, change in hearing, chest pain, cough, nausea vomiting, urinary symptoms. All other systems are reviewed and are negative.  PHYSICAL EXAM  Patient is stable today. She is oriented to person time and place. Affect is normal. Head is atraumatic. Sclera and conjunctiva are normal. There is no jugulovenous distention. Lungs are clear. Respiratory effort is nonlabored. Cardiac exam reveals S1 and S2. The abdomen is soft. There is no significant peripheral edema.  Filed Vitals:   10/05/13 1142  BP: 125/66  Pulse: 61  Height: 5\' 1"  (1.549 m)  Weight: 141 lb 12.8 oz (64.32 kg)     ASSESSMENT & PLAN

## 2013-10-05 NOTE — Assessment & Plan Note (Signed)
Blood pressure is controlled on her current regimen. The renal artery Doppler results are stable. We are not surprised by the lack of flow to the left kidney. There is mild narrowing of the artery to the right kidney. No further workup. She will continue a low dose of hydrochlorothiazide. She will not take clonidine. Chemistry level be checked in the next week since the hydrochlorothiazide is new for her.

## 2013-10-05 NOTE — Assessment & Plan Note (Signed)
Coronary disease is stable.  No further workup. 

## 2013-10-19 ENCOUNTER — Encounter: Payer: Self-pay | Admitting: Cardiology

## 2013-10-31 ENCOUNTER — Telehealth: Payer: Self-pay | Admitting: Cardiology

## 2013-10-31 NOTE — Telephone Encounter (Signed)
Mrs. Linda [name] called office today regarding a pain she has had in her throat off and on since 10/25/13. She is in [place] with her family And will not be home until Friday 11/02/13. She also states that she is feeling fatigue. She just wanted this to be documented.

## 2013-11-01 NOTE — Telephone Encounter (Signed)
Spoke with patient while she was en route to her home traveling from [place]. Patient  C/O throat pain with sob on last Thursday. Patient said It resolved and came back on yesterday that lasted for about 2 hours. Patient said that she had throat pain in the past and was sent to the hospital for stent placement. Patient said she did have a very small feeling of chest pain. Patient said that her pain wasn't severe enough to use nitroglycerin. Patient wanted to know what her cardiologist felt about this. Nurse advised patient to use her nitroglycerin as directed and if no relief after the 3 rd dose, to proceed to the ED for evaluation.

## 2013-11-06 ENCOUNTER — Emergency Department (HOSPITAL_COMMUNITY): Payer: Medicare Other

## 2013-11-06 ENCOUNTER — Encounter (HOSPITAL_COMMUNITY): Payer: Self-pay | Admitting: Emergency Medicine

## 2013-11-06 ENCOUNTER — Observation Stay (HOSPITAL_COMMUNITY)
Admission: EM | Admit: 2013-11-06 | Discharge: 2013-11-08 | Disposition: A | Discharge status: Home / Self Care | Payer: Medicare Other | Attending: Cardiovascular Disease | Admitting: Cardiovascular Disease

## 2013-11-06 ENCOUNTER — Telehealth: Payer: Self-pay | Admitting: Cardiology

## 2013-11-06 DIAGNOSIS — Z87891 Personal history of nicotine dependence: Secondary | ICD-10-CM | POA: Diagnosis not present

## 2013-11-06 DIAGNOSIS — G459 Transient cerebral ischemic attack, unspecified: Secondary | ICD-10-CM | POA: Diagnosis present

## 2013-11-06 DIAGNOSIS — Z8673 Personal history of transient ischemic attack (TIA), and cerebral infarction without residual deficits: Secondary | ICD-10-CM | POA: Diagnosis not present

## 2013-11-06 DIAGNOSIS — Z7982 Long term (current) use of aspirin: Secondary | ICD-10-CM | POA: Diagnosis not present

## 2013-11-06 DIAGNOSIS — Z7901 Long term (current) use of anticoagulants: Secondary | ICD-10-CM | POA: Diagnosis not present

## 2013-11-06 DIAGNOSIS — Z79899 Other long term (current) drug therapy: Secondary | ICD-10-CM | POA: Diagnosis not present

## 2013-11-06 DIAGNOSIS — I2510 Atherosclerotic heart disease of native coronary artery without angina pectoris: Principal | ICD-10-CM | POA: Insufficient documentation

## 2013-11-06 DIAGNOSIS — I209 Angina pectoris, unspecified: Secondary | ICD-10-CM

## 2013-11-06 DIAGNOSIS — I701 Atherosclerosis of renal artery: Secondary | ICD-10-CM | POA: Insufficient documentation

## 2013-11-06 DIAGNOSIS — E782 Mixed hyperlipidemia: Secondary | ICD-10-CM | POA: Diagnosis not present

## 2013-11-06 DIAGNOSIS — R0789 Other chest pain: Secondary | ICD-10-CM | POA: Diagnosis present

## 2013-11-06 DIAGNOSIS — I498 Other specified cardiac arrhythmias: Secondary | ICD-10-CM

## 2013-11-06 DIAGNOSIS — J449 Chronic obstructive pulmonary disease, unspecified: Secondary | ICD-10-CM | POA: Insufficient documentation

## 2013-11-06 DIAGNOSIS — R079 Chest pain, unspecified: Secondary | ICD-10-CM

## 2013-11-06 DIAGNOSIS — I252 Old myocardial infarction: Secondary | ICD-10-CM | POA: Insufficient documentation

## 2013-11-06 DIAGNOSIS — Z951 Presence of aortocoronary bypass graft: Secondary | ICD-10-CM | POA: Insufficient documentation

## 2013-11-06 DIAGNOSIS — Z7902 Long term (current) use of antithrombotics/antiplatelets: Secondary | ICD-10-CM | POA: Insufficient documentation

## 2013-11-06 DIAGNOSIS — I200 Unstable angina: Secondary | ICD-10-CM | POA: Insufficient documentation

## 2013-11-06 DIAGNOSIS — I25708 Atherosclerosis of coronary artery bypass graft(s), unspecified, with other forms of angina pectoris: Secondary | ICD-10-CM

## 2013-11-06 DIAGNOSIS — I739 Peripheral vascular disease, unspecified: Secondary | ICD-10-CM | POA: Diagnosis present

## 2013-11-06 DIAGNOSIS — I779 Disorder of arteries and arterioles, unspecified: Secondary | ICD-10-CM

## 2013-11-06 DIAGNOSIS — I25810 Atherosclerosis of coronary artery bypass graft(s) without angina pectoris: Secondary | ICD-10-CM

## 2013-11-06 DIAGNOSIS — I2582 Chronic total occlusion of coronary artery: Secondary | ICD-10-CM | POA: Insufficient documentation

## 2013-11-06 DIAGNOSIS — I6529 Occlusion and stenosis of unspecified carotid artery: Secondary | ICD-10-CM | POA: Insufficient documentation

## 2013-11-06 DIAGNOSIS — I10 Essential (primary) hypertension: Secondary | ICD-10-CM | POA: Insufficient documentation

## 2013-11-06 DIAGNOSIS — I25119 Atherosclerotic heart disease of native coronary artery with unspecified angina pectoris: Secondary | ICD-10-CM

## 2013-11-06 DIAGNOSIS — R001 Bradycardia, unspecified: Secondary | ICD-10-CM | POA: Diagnosis present

## 2013-11-06 DIAGNOSIS — J4489 Other specified chronic obstructive pulmonary disease: Secondary | ICD-10-CM | POA: Insufficient documentation

## 2013-11-06 LAB — I-STAT TROPONIN, ED
Troponin i, poc: 0 ng/mL (ref 0.00–0.08)
Troponin i, poc: 0.01 ng/mL (ref 0.00–0.08)

## 2013-11-06 LAB — CBC
HCT: 40 % (ref 36.0–46.0)
Hemoglobin: 13.1 g/dL (ref 12.0–15.0)
MCH: 30.5 pg (ref 26.0–34.0)
MCHC: 32.8 g/dL (ref 30.0–36.0)
MCV: 93.2 fL (ref 78.0–100.0)
PLATELETS: 233 10*3/uL (ref 150–400)
RBC: 4.29 MIL/uL (ref 3.87–5.11)
RDW: 13.3 % (ref 11.5–15.5)
WBC: 6.3 10*3/uL (ref 4.0–10.5)

## 2013-11-06 LAB — BASIC METABOLIC PANEL
ANION GAP: 12 (ref 5–15)
BUN: 21 mg/dL (ref 6–23)
CALCIUM: 9.6 mg/dL (ref 8.4–10.5)
CO2: 27 mEq/L (ref 19–32)
CREATININE: 0.87 mg/dL (ref 0.50–1.10)
Chloride: 102 mEq/L (ref 96–112)
GFR calc non Af Amer: 65 mL/min — ABNORMAL LOW (ref >90)
GFR, EST AFRICAN AMERICAN: 76 mL/min — AB (ref >90)
Glucose, Bld: 112 mg/dL — ABNORMAL HIGH (ref 70–99)
Potassium: 4.4 mEq/L (ref 3.7–5.3)
SODIUM: 141 meq/L (ref 137–147)

## 2013-11-06 MED ORDER — SODIUM CHLORIDE 0.9 % IV SOLN: 250.0000 mL | INTRAVENOUS | Status: DC | PRN

## 2013-11-06 MED ORDER — DOCUSATE SODIUM 100 MG PO CAPS
200.0000 mg | ORAL_CAPSULE | Freq: Every day | ORAL | Status: DC
  Administered 2013-11-06 – 2013-11-07 (×2): 200 mg via ORAL
  Filled 2013-11-06 (×3): qty 2

## 2013-11-06 MED ORDER — METOPROLOL SUCCINATE ER 50 MG PO TB24
75.0000 mg | ORAL_TABLET | Freq: Every day | ORAL | Status: DC
  Administered 2013-11-07: 75 mg via ORAL
  Filled 2013-11-06 (×2): qty 1

## 2013-11-06 MED ORDER — ASPIRIN EC 81 MG PO TBEC: 81.0000 mg | DELAYED_RELEASE_TABLET | Freq: Every day | ORAL | Status: DC

## 2013-11-06 MED ORDER — IRBESARTAN 300 MG PO TABS
300.0000 mg | ORAL_TABLET | Freq: Every day | ORAL | Status: DC
  Administered 2013-11-07: 300 mg via ORAL
  Filled 2013-11-06 (×2): qty 1

## 2013-11-06 MED ORDER — ACETAMINOPHEN 325 MG PO TABS: 650.0000 mg | ORAL_TABLET | ORAL | Status: DC | PRN

## 2013-11-06 MED ORDER — ENOXAPARIN SODIUM 40 MG/0.4ML ~~LOC~~ SOLN
40.0000 mg | Freq: Every day | SUBCUTANEOUS | Status: DC
  Administered 2013-11-07: 40 mg via SUBCUTANEOUS
  Filled 2013-11-06 (×2): qty 0.4

## 2013-11-06 MED ORDER — NITROGLYCERIN 0.4 MG SL SUBL: 0.4000 mg | SUBLINGUAL_TABLET | SUBLINGUAL | Status: DC | PRN

## 2013-11-06 MED ORDER — HYDROCHLOROTHIAZIDE 12.5 MG PO CAPS
12.5000 mg | ORAL_CAPSULE | Freq: Every day | ORAL | Status: DC
  Administered 2013-11-07: 12.5 mg via ORAL
  Filled 2013-11-06 (×2): qty 1

## 2013-11-06 MED ORDER — SODIUM CHLORIDE 0.9 % IJ SOLN
3.0000 mL | Freq: Two times a day (BID) | INTRAMUSCULAR | Status: DC
Start: 2013-11-06 — End: 2013-11-08
  Administered 2013-11-06 – 2013-11-07 (×3): 3 mL via INTRAVENOUS

## 2013-11-06 MED ORDER — ISOSORBIDE MONONITRATE ER 60 MG PO TB24
60.0000 mg | ORAL_TABLET | Freq: Every day | ORAL | Status: DC
  Administered 2013-11-07 – 2013-11-08 (×2): 60 mg via ORAL
  Filled 2013-11-06 (×2): qty 1

## 2013-11-06 MED ORDER — CLOPIDOGREL BISULFATE 75 MG PO TABS
75.0000 mg | ORAL_TABLET | Freq: Every day | ORAL | Status: DC
  Administered 2013-11-07 – 2013-11-08 (×2): 75 mg via ORAL
  Filled 2013-11-06: qty 1

## 2013-11-06 MED ORDER — ASPIRIN 81 MG PO CHEW
324.0000 mg | CHEWABLE_TABLET | ORAL | Status: AC
  Administered 2013-11-06: 324 mg via ORAL
  Filled 2013-11-06 (×2): qty 4

## 2013-11-06 MED ORDER — ONDANSETRON HCL 4 MG/2ML IJ SOLN: 4.0000 mg | Freq: Four times a day (QID) | INTRAMUSCULAR | Status: DC | PRN

## 2013-11-06 MED ORDER — GABAPENTIN 100 MG PO CAPS
100.0000 mg | ORAL_CAPSULE | Freq: Every day | ORAL | Status: DC
  Administered 2013-11-06 – 2013-11-07 (×2): 100 mg via ORAL
  Filled 2013-11-06 (×3): qty 1

## 2013-11-06 MED ORDER — ASPIRIN 300 MG RE SUPP: 300.0000 mg | RECTAL | Status: AC

## 2013-11-06 MED ORDER — SODIUM CHLORIDE 0.9 % IJ SOLN: 3.0000 mL | INTRAMUSCULAR | Status: DC | PRN

## 2013-11-06 MED ORDER — AMLODIPINE BESYLATE 2.5 MG PO TABS
2.5000 mg | ORAL_TABLET | Freq: Every day | ORAL | Status: DC
  Administered 2013-11-07: 2.5 mg via ORAL
  Filled 2013-11-06 (×2): qty 1

## 2013-11-06 MED ORDER — ATORVASTATIN CALCIUM 40 MG PO TABS
40.0000 mg | ORAL_TABLET | Freq: Every day | ORAL | Status: DC
Start: 2013-11-07 — End: 2013-11-08
  Administered 2013-11-07 – 2013-11-08 (×2): 40 mg via ORAL
  Filled 2013-11-06 (×2): qty 1

## 2013-11-06 MED ORDER — ASPIRIN EC 81 MG PO TBEC
81.0000 mg | DELAYED_RELEASE_TABLET | Freq: Every day | ORAL | Status: DC
  Administered 2013-11-07 – 2013-11-08 (×2): 81 mg via ORAL
  Filled 2013-11-06 (×2): qty 1

## 2013-11-06 NOTE — Telephone Encounter (Signed)
Mrs. Linda [name] called today stating that she continues not to feel well. States that she is very fatigue, continues to feel pressure in her chest. Linda [name] states I just don't feel well. I would like to Know if someone in the [place] office can see me since Dr.Katz is not in the office. I called [place] office and was told to send a triage message for Nurse to evaluate patient.  Please call on her cell #

## 2013-11-06 NOTE — ED Notes (Signed)
Attempted to call report x 1  

## 2013-11-06 NOTE — Telephone Encounter (Signed)
Patient c/o Chest pressure since last week thats been intermittent. Patient also c/o having indigestion all the time. Patient rates her chest pressure a 5 on a scale of 1-10 (10 being the greatest). Patient c/o SOB and c/o of some lightheadedness and dizziness. Patient informed nurse that she hasn't used her nitroglycerin. Nurse instructed to patient to use her nitroglycerin as prescribed and since her symptoms have been ongoing since last week that she needed to go to the ED for an evaluation. Patient verbalized understanding of plan.

## 2013-11-06 NOTE — H&P (Signed)
Physician History and Physical    Linda [name] Gail Mullenax MRN: [id number] DOB/AGE: 72/09/1940 72 y.o. Admit date: 11/06/2013  Primary Care Physician: Primary Cardiologist: Jerral BonitoJeff Katz, MD  HPI:  Linda [name] is a very pleasant 73 year old female with a history of coronary artery disease status post coronary bypass grafting x3 in 2002 s/p PCI of the ostium of the SVG to the OM in 2003 and PCI with DES to the SVG to OM in July 2014. She also has a history of hypertension, hyperlipidemia, bradycardia, peripheral artery disease status post stent to the right common iliac artery in 2012, TIA, renal artery stenosis, and carotid artery disease. Her last cardiac catheterization was in December 2014 which showed severe native CAD, with 60% left main stenosis and patent LAD stents. 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 Patent SVG to RCA and SVG to OM.  LV function by nuclear MPI in 08/2013 was normal, calculated LVEF 77%. There was no evidence of ischemia or scar, with mild anteroapical soft tissue attenuation noted.  She had been visiting her granddaughter and great granddaughter in [place] last week. While sitting on the couch, she began experiencing throat pain. She says that prior to CABG and subsequent stents, her primary symptom was throat pain. It waxed and waned over 2 hours and then dissipated without the use of SL nitro. [facility] office and asked that Dr. Myrtis SerKatz be notified. She then spoke with Isabelle CourseLydia the following day when they were driving back to [place], asking her what to do. The throat pain continued to occur intermittently over the subsequent days, but became more frequent. [place], and experienced some mild chest pressure and very mild shortness of breath. She said her "head felt full" but denies associated dizziness. She also denies palpitations, leg swelling, orthopnea, and paroxysmal  nocturnal dyspnea. She was instructed to come to the [facility].  Soc: Married. Lives in [place]. Quit smoking in 2002.   Review of systems complete and found to be negative unless listed above   No family history of premature CAD in 1st degree relatives.  History   Social History  . Marital Status: Married    Spouse Name: N/A    Number of Children: N/A  . Years of Education: N/A   Occupational History  . Retired    Social History Main Topics  . Smoking status: Former Smoker -- 1.00 packs/day for 35 years    Types: Cigarettes    Quit date: 03/30/2001  . Smokeless tobacco: Former NeurosurgeonUser    Types: Snuff  . Alcohol Use: No  . Drug Use: No  . Sexual Activity: No   Other Topics Concern  . Not on file   Social History Narrative   She lives is [place] with husband and has two biological children.        (Not in a hospital admission)  Physical Exam: Blood pressure 141/68, pulse 53, temperature 97.8 F (36.6 C), temperature source Oral, resp. rate 16, height 5\' 1"  (1.549 m), weight 143 lb (64.864 kg), SpO2 93.00%.  General: NAD Neck: No JVD, no thyromegaly or thyroid nodule.  Lungs: Clear to auscultation bilaterally with normal respiratory effort. CV: Nondisplaced PMI.  Heart regular S1/S2, no S3/S4, no murmur.  No peripheral edema.  No carotid bruit.  Normal pedal pulses.  Abdomen: Soft, nontender, no hepatosplenomegaly, no distention.  Skin: Intact without lesions  or rashes.  Neurologic: Alert and oriented x 3.  Psych: Normal affect. Extremities: No clubbing or cyanosis.  HEENT: Normal.   Labs:   Lab Results  Component Value Date   WBC 6.3 11/06/2013   HGB 13.1 11/06/2013   HCT 40.0 11/06/2013   MCV 93.2 11/06/2013   PLT 233 11/06/2013    Recent Labs Lab 11/06/13 1627  NA 141  K 4.4  CL 102  CO2 27  BUN 21  CREATININE 0.87  CALCIUM 9.6  GLUCOSE 112*   Lab Results  Component Value Date   TROPONINI <0.30 09/15/2013    Lab Results  Component Value  Date   CHOL 124 09/15/2013   CHOL 129 04/18/2013   Lab Results  Component Value Date   HDL 47 09/15/2013   HDL 42 25/95/6387   Lab Results  Component Value Date   LDLCALC 60 09/15/2013   LDLCALC 55 04/18/2013   Lab Results  Component Value Date   TRIG 84 09/15/2013   TRIG 162* 04/18/2013   Lab Results  Component Value Date   CHOLHDL 2.6 09/15/2013   CHOLHDL 3.1 04/18/2013   No results found for this basename: LDLDIRECT       EKG: Sinus rhythm, left posterior fascicular block, nonspecific ST-T abnormality in inferior and precordial leads (slightly more prominent when compared to 09/15/13)  Radiology: No acute cardiopulmonary processes (no edema or infiltrates).   ASSESSMENT AND PLAN:  1. Throat pain (anginal equivalent) and chest pressure in the setting of CAD, CABG, and multivessel PCI: Will cycle serial troponins to rule out for an acute coronary syndrome. She tells me she had been on Imdur 90 mg in the past but BP became too low. Will continue current meds which include ASA, Plavix, Imdur 60 mg, Lipitor, and metoprolol. Given recent normal nuclear MPI study, would not repeat. If no ACS, consider medication optimization with addition of Ranexa which would not affect BP. However, given the nature of her symptoms which correlate with symptoms she experienced prior to CABG and subsequent PCI's, would not rule out the possibility of coronary angiography.  2. HTN: SBP had been in 90 mmHg range upon admission, now in 150 mmHg range. Will resume amlodipine, ARB, and HCTZ.  3. Hyperlipidemia: LDL 60, HDL 47 on 09/15/13. Continue Lipitor 40 mg daily.  4. PVD/carotid artery disease: Stable. Continue ASA, statin, and Plavix.  5. Renal artery stenosis: 1-59% right RAS with no flow to left kidney by renal artery duplex in 09/2013. Creatinine 0.87. Monitor.  Signed: Prentice Docker, M.D., F.A.C.C. 11/06/2013, 9:53 PM

## 2013-11-06 NOTE — ED Notes (Signed)
Dr. Nanavati at bedside 

## 2013-11-06 NOTE — ED Notes (Signed)
MD at bedside. 

## 2013-11-06 NOTE — ED Provider Notes (Signed)
CSN: [id number]     Arrival date & time 11/06/13  1555 History   First MD Initiated Contact with Patient 11/06/13 1652     Chief Complaint  Patient presents with  . Chest Pain     (Consider location/radiation/quality/duration/timing/severity/associated sxs/prior Treatment) HPI 73 y/o female with past medical history of CAD that presents with chest pressure pain that has been intermittent for the last week with no associated nausea/vomiting. Patient states that she has felt a throat pain recently which was her presenting symptom for her past MI that lead to CABG. There is concern that this is an anginal equivalent.   Past Medical History  Diagnosis Date  . Hypertension     Unspecified   . Hyperlipidemia, mixed   . CAD (coronary artery disease)     a. s/p CABG x3v (2002); NSTEMI s/p PCI of the ostium of SVG to OM (2003); PCI with DES of SVG to OM (10/2012)   . Bradycardia   . PAD (peripheral artery disease)     a. s/p stent to right common iliac artery (2012)  . TIA (transient ischemic attack) 1990's X 2-3  . Claudication   . Renal artery stenosis     95% stenosis  known from the past., Small left kidney, medical therapy  . Carotid artery disease     November, 2013  //   Doppler, March, 2014, stable, 0-39% bilateral, followup 2 years  . Shoulder pain, left   . Myocardial infarction 11/15/2012  . Daily headache     "stopped when I had my cataract fixed earlier this year" (09/14/2013)   Past Surgical History  Procedure Laterality Date  . Coronary artery bypass graft  03/2001    "CABG X3"   . Cardiac catheterization      "several; last one was 04/18/2013" (09/14/2013)  . Coronary angioplasty with stent placement      "counting the 2 they put in 10/2012 I've got 6 or 7 stents total"  . Knee surgery Left ~ 2004    "shattered; left me w/ 1/2 of a kneecap"   . Tonsillectomy and adenoidectomy  ~ 1948  . Cataract extraction w/ intraocular lens  implant, bilateral Bilateral 2015  . Iliac  artery stent Right 2012  . Dilation and curettage of uterus  1964    S/P miscarriage   Family History  Problem Relation Age of Onset  . Coronary artery disease    . Heart attack Mother   . Heart failure Father   . Cancer Mother   . Heart failure Maternal Grandmother   . Diabetes Maternal Grandmother   . Kidney disease Paternal Grandfather    History  Substance Use Topics  . Smoking status: Former Smoker -- 1.00 packs/day for 35 years    Types: Cigarettes    Quit date: 03/30/2001  . Smokeless tobacco: Former Neurosurgeon    Types: Snuff  . Alcohol Use: No   OB History   Grav Para Term Preterm Abortions TAB SAB Ect Mult Living                 Review of Systems  Constitutional: Negative for activity change.  HENT: Negative for congestion.   Respiratory: Positive for chest tightness and shortness of breath. Negative for cough.   Cardiovascular: Negative for chest pain and leg swelling.  Gastrointestinal: Negative for nausea, vomiting, abdominal pain, diarrhea, constipation, blood in stool and abdominal distention.  Genitourinary: Negative for dysuria, flank pain and vaginal discharge.  Musculoskeletal: Negative for back  pain.  Skin: Negative for color change.  Neurological: Negative for syncope and headaches.  Psychiatric/Behavioral: Negative for agitation.      Allergies  Sulfonamide derivatives  Home Medications   Prior to Admission medications   Medication Sig Start Date End Date Taking? Authorizing Provider  amLODipine (NORVASC) 5 MG tablet Take 2.5 mg by mouth daily.    Yes Historical Provider, MD  aspirin 81 MG tablet Take 81 mg by mouth daily.     Yes Historical Provider, MD  atorvastatin (LIPITOR) 40 MG tablet Take 40 mg by mouth daily.   Yes Historical Provider, MD  clopidogrel (PLAVIX) 75 MG tablet Take 75 mg by mouth daily.   Yes Historical Provider, MD  docusate sodium (COLACE) 100 MG capsule Take 200 mg by mouth at bedtime.    Yes Historical Provider, MD   gabapentin (NEURONTIN) 100 MG capsule Take 100 mg by mouth at bedtime.   Yes Historical Provider, MD  hydrochlorothiazide (MICROZIDE) 12.5 MG capsule Take 12.5 mg by mouth daily.   Yes Historical Provider, MD  isosorbide mononitrate (IMDUR) 60 MG 24 hr tablet Take 60 mg by mouth daily.    Yes Historical Provider, MD  metoprolol (TOPROL-XL) 50 MG 24 hr tablet Take 75 mg by mouth daily.    Yes Historical Provider, MD  nitroGLYCERIN (NITROSTAT) 0.4 MG SL tablet Place 1 tablet (0.4 mg total) under the tongue every 5 (five) minutes as needed. For chest pain 11/16/12  Yes Rhonda G Barrett, PA-C  olmesartan (BENICAR) 40 MG tablet Take 40 mg by mouth daily.   Yes Historical Provider, MD  Omega-3 Fatty Acids (FISH OIL) 1000 MG CAPS Take 2 capsules (2,000 mg total) by mouth 2 (two) times daily. 04/18/13  Yes Thereasa Parkin, PA-C   BP 98/58  Pulse 57  Temp(Src) 97.8 F (36.6 C) (Oral)  Resp 16  Ht 5\' 1"  (1.549 m)  Wt 143 lb (64.864 kg)  BMI 27.03 kg/m2  SpO2 94% Physical Exam  Constitutional: She is oriented to person, place, and time. She appears well-developed.  HENT:  Head: Normocephalic.  Eyes: Pupils are equal, round, and reactive to light.  Neck: Neck supple.  Cardiovascular: Normal rate.  Exam reveals no gallop and no friction rub.   No murmur heard. Pulmonary/Chest: Effort normal and breath sounds normal. No respiratory distress.  Abdominal: Soft. She exhibits no distension. There is no tenderness. There is no rebound.  Musculoskeletal: She exhibits no edema.  Neurological: She is alert and oriented to person, place, and time.  Skin: Skin is warm.  Psychiatric: She has a normal mood and affect.    ED Course  Procedures (including critical care time) Labs Review Labs Reviewed  BASIC METABOLIC PANEL - Abnormal; Notable for the following:    Glucose, Bld 112 (*)    GFR calc non Af Amer 65 (*)    GFR calc Af Amer 76 (*)    All other components within normal limits  CBC  I-STAT  TROPOININ, ED    Imaging Review Dg Chest 2 View  11/06/2013   CLINICAL DATA:  COPD. Shortness breath. History of CABG. Hypertension. Nonsmoker.  EXAM: CHEST  2 VIEW  COMPARISON:  09/14/2013 CT and chest x-ray.  FINDINGS: No infiltrate, congestive heart failure or pneumothorax. The tiny nodule noted in the left upper lobe on prior chest CT is not evaluated by plain film.  Prominent coronary artery calcifications with stents in place. Heart size within normal limits.  Calcified tortuous aorta.  IMPRESSION: No  infiltrate, congestive heart failure or pneumothorax.  Prominent coronary artery calcifications with stents in place.  Calcified tortuous aorta.   Electronically Signed   By: Bridgett LarssonSteve  Olson M.D.   On: 11/06/2013 16:47     EKG Interpretation   Date/Time:  Tuesday November 06 2013 15:58:11 EDT Ventricular Rate:  69 PR Interval:  186 QRS Duration: 90 QT Interval:  396 QTC Calculation: 424 R Axis:   117 Text Interpretation:  Normal sinus rhythm Left posterior fascicular block  T wave abnormality, consider anterior ischemia Abnormal ECG Confirmed by  Rhunette CroftNANAVATI, MD, Janey GentaANKIT [phone]([id number]) on 11/06/2013 5:37:36 PM      MDM   Final diagnoses:  Ischemic chest pain  Bradycardia  Bilateral carotid artery disease  Hyperlipidemia, mixed  Essential hypertension  PAD (peripheral artery disease)  Renal artery stenosis  S/P CABG (coronary artery bypass graft)  Coronary artery disease involving coronary bypass graft with other forms of angina pectoris  History of TIA (transient ischemic attack)  Coronary artery disease involving native coronary artery of native heart with angina pectoris   73 y/o female with past medical history of CAD s/p CABG and multiple stents that presents with chest tightness and concern for throat pain that may be anginal equivalent. EKG- normal sinus with no evidence of ischemia. Trop negative. CBC and BMP noncontributory.   Cardiology consulted and decided to admit for ACS rule  out.     Clement SayresStaci Elie Gragert, MD 11/08/13 1744

## 2013-11-06 NOTE — ED Notes (Signed)
Spoke with Cards, requested that he come down and see pt for admission. States she is the third patient on his list and will be evaluating shortly

## 2013-11-06 NOTE — ED Provider Notes (Signed)
I performed a history and physical examination of  Linda [name] and discussed her management with resident doctor. I agree with the history, physical, assessment, and plan of care, with the following exceptions: None I was present for the following procedures: None  Time Spent in Critical Care of the patient: None  Time spent in discussions with the patient and family: 10 min  Linda [name], Linda [name]   73 year old female patient of Linda [name] with known history of CAD, CABG 2002, non-ST elevation MI with PCI using DES to the SVG to OM in July 2014 comes in with atypical chest pain, on going for a week. Neg lexiscan in may. Pain however warrants further cardiac clearance, and thus cards consulted. Our exam and ekg findings are unremarkable. Troponin is neg, chestp ain free during my evaluation.   EKG Interpretation  Date/Time:  Tuesday November 06 2013 15:58:11 EDT Ventricular Rate:  69 PR Interval:  186 QRS Duration: 90 QT Interval:  396 QTC Calculation: 424 R Axis:   117 Text Interpretation:  Normal sinus rhythm Left posterior fascicular block T wave abnormality, consider anterior ischemia Abnormal ECG Confirmed by Linda CroftNANAVATI, MD, Linda [name] [phone]) on 11/06/2013 5:37:36 PM          Linda KaplanAnkit Asiah Befort, MD 11/07/13 1235

## 2013-11-06 NOTE — ED Notes (Signed)
Pt c/o intermittent chest pain x one week. Began having throat pain en route to [place] two weeks ago. Sts throat pain associated with prior stent placement.  Hx of CABG 13 years ago, recent cardiac cath in December 2014 with stress test in May; also has 6 stents. Pt's CP associated with L arm pain.  Pain at this time is 1/10.

## 2013-11-06 NOTE — ED Notes (Signed)
Dr. Shepard at bedside.  

## 2013-11-06 NOTE — ED Notes (Addendum)
Present with sternal chest tightness that is intermittent for one week, at times pain radiates in left arm. Pain lasts for seconds at a time and then goes away. Pain is getting worse everyday associated with SOB and lightheaded. Denies nausea. Cardiologist is Dr. Myrtis SerKatz. Denies pain at this time, reports discomfort of 2/10.  Nothing makes pain worse or brings pain on. Nothing makes pain better.  C/o indigestion for the last week all the time.

## 2013-11-07 ENCOUNTER — Encounter (HOSPITAL_COMMUNITY)
Admission: EM | Disposition: A | Discharge status: Home / Self Care | Payer: Medicare Other | Source: Home / Self Care | Attending: Emergency Medicine

## 2013-11-07 ENCOUNTER — Encounter (HOSPITAL_COMMUNITY): Payer: Self-pay

## 2013-11-07 DIAGNOSIS — E785 Hyperlipidemia, unspecified: Secondary | ICD-10-CM

## 2013-11-07 DIAGNOSIS — I701 Atherosclerosis of renal artery: Secondary | ICD-10-CM | POA: Diagnosis not present

## 2013-11-07 DIAGNOSIS — I2510 Atherosclerotic heart disease of native coronary artery without angina pectoris: Secondary | ICD-10-CM | POA: Diagnosis not present

## 2013-11-07 DIAGNOSIS — I2582 Chronic total occlusion of coronary artery: Secondary | ICD-10-CM | POA: Diagnosis not present

## 2013-11-07 DIAGNOSIS — I200 Unstable angina: Secondary | ICD-10-CM | POA: Diagnosis not present

## 2013-11-07 HISTORY — PX: LEFT HEART CATHETERIZATION WITH CORONARY/GRAFT ANGIOGRAM: SHX5450

## 2013-11-07 LAB — LIPID PANEL
CHOL/HDL RATIO: 2.9 ratio
CHOLESTEROL: 130 mg/dL (ref 0–200)
HDL: 45 mg/dL (ref >39)
LDL Cholesterol: 56 mg/dL (ref 0–99)
Triglycerides: 143 mg/dL (ref <150)
VLDL: 29 mg/dL (ref 0–40)

## 2013-11-07 LAB — CBC
HCT: 39.1 % (ref 36.0–46.0)
Hemoglobin: 12.8 g/dL (ref 12.0–15.0)
MCH: 30.7 pg (ref 26.0–34.0)
MCHC: 32.7 g/dL (ref 30.0–36.0)
MCV: 93.8 fL (ref 78.0–100.0)
Platelets: 211 10*3/uL (ref 150–400)
RBC: 4.17 MIL/uL (ref 3.87–5.11)
RDW: 13.4 % (ref 11.5–15.5)
WBC: 5.5 10*3/uL (ref 4.0–10.5)

## 2013-11-07 LAB — COMPREHENSIVE METABOLIC PANEL
ALK PHOS: 38 U/L — AB (ref 39–117)
ALT: 14 U/L (ref 0–35)
AST: 17 U/L (ref 0–37)
Albumin: 3.5 g/dL (ref 3.5–5.2)
Anion gap: 14 (ref 5–15)
BUN: 22 mg/dL (ref 6–23)
CHLORIDE: 103 meq/L (ref 96–112)
CO2: 26 meq/L (ref 19–32)
Calcium: 9.4 mg/dL (ref 8.4–10.5)
Creatinine, Ser: 1.03 mg/dL (ref 0.50–1.10)
GFR calc Af Amer: 62 mL/min — ABNORMAL LOW (ref >90)
GFR, EST NON AFRICAN AMERICAN: 53 mL/min — AB (ref >90)
GLUCOSE: 84 mg/dL (ref 70–99)
POTASSIUM: 4.5 meq/L (ref 3.7–5.3)
SODIUM: 143 meq/L (ref 137–147)
Total Bilirubin: 0.4 mg/dL (ref 0.3–1.2)
Total Protein: 6.9 g/dL (ref 6.0–8.3)

## 2013-11-07 LAB — TROPONIN I
Troponin I: <0.3 ng/mL (ref <0.30)
Troponin I: <0.3 ng/mL (ref <0.30)
Troponin I: <0.3 ng/mL (ref <0.30)

## 2013-11-07 LAB — T4, FREE: Free T4: 1.16 ng/dL (ref 0.80–1.80)

## 2013-11-07 LAB — MAGNESIUM: Magnesium: 2.1 mg/dL (ref 1.5–2.5)

## 2013-11-07 LAB — POCT ACTIVATED CLOTTING TIME
Activated Clotting Time: 185 seconds
Activated Clotting Time: 309 seconds

## 2013-11-07 LAB — HEMOGLOBIN A1C
HEMOGLOBIN A1C: 6 % — AB (ref <5.7)
MEAN PLASMA GLUCOSE: 126 mg/dL — AB (ref <117)

## 2013-11-07 LAB — PROTIME-INR
INR: 0.93 (ref 0.00–1.49)
Prothrombin Time: 12.5 seconds (ref 11.6–15.2)

## 2013-11-07 LAB — TSH: TSH: 5.66 u[IU]/mL — ABNORMAL HIGH (ref 0.350–4.500)

## 2013-11-07 SURGERY — LEFT HEART CATHETERIZATION WITH CORONARY/GRAFT ANGIOGRAM
Anesthesia: LOCAL

## 2013-11-07 MED ORDER — MIDAZOLAM HCL 2 MG/2ML IJ SOLN
INTRAMUSCULAR | Status: AC
  Filled 2013-11-07: qty 2

## 2013-11-07 MED ORDER — NITROGLYCERIN 0.2 MG/ML ON CALL CATH LAB
INTRAVENOUS | Status: AC
  Filled 2013-11-07: qty 1

## 2013-11-07 MED ORDER — VERAPAMIL HCL 2.5 MG/ML IV SOLN
INTRAVENOUS | Status: AC
  Filled 2013-11-07: qty 2

## 2013-11-07 MED ORDER — SODIUM CHLORIDE 0.9 % IV SOLN: 250.0000 mL | INTRAVENOUS | Status: DC | PRN

## 2013-11-07 MED ORDER — SODIUM CHLORIDE 0.9 % IV SOLN: 1.0000 mL/kg/h | INTRAVENOUS | Status: DC

## 2013-11-07 MED ORDER — SODIUM CHLORIDE 0.9 % IJ SOLN: 3.0000 mL | INTRAMUSCULAR | Status: DC | PRN

## 2013-11-07 MED ORDER — SODIUM CHLORIDE 0.9 % IV SOLN: 1.0000 mL/kg/h | INTRAVENOUS | Status: AC

## 2013-11-07 MED ORDER — ADENOSINE 12 MG/4ML IV SOLN
12.0000 mL | Freq: Once | INTRAVENOUS | Status: DC
  Filled 2013-11-07: qty 12

## 2013-11-07 MED ORDER — LIDOCAINE HCL (PF) 1 % IJ SOLN
INTRAMUSCULAR | Status: AC
  Filled 2013-11-07: qty 30

## 2013-11-07 MED ORDER — HEPARIN SODIUM (PORCINE) 1000 UNIT/ML IJ SOLN
INTRAMUSCULAR | Status: AC
  Filled 2013-11-07: qty 1

## 2013-11-07 MED ORDER — SODIUM CHLORIDE 0.9 % IJ SOLN: 3.0000 mL | Freq: Two times a day (BID) | INTRAMUSCULAR | Status: DC

## 2013-11-07 MED ORDER — FENTANYL CITRATE 0.05 MG/ML IJ SOLN
INTRAMUSCULAR | Status: AC
  Filled 2013-11-07: qty 2

## 2013-11-07 MED ORDER — HEPARIN (PORCINE) IN NACL 2-0.9 UNIT/ML-% IJ SOLN
INTRAMUSCULAR | Status: AC
  Filled 2013-11-07: qty 1500

## 2013-11-07 MED ORDER — SODIUM CHLORIDE 0.9 % IJ SOLN
3.0000 mL | Freq: Two times a day (BID) | INTRAMUSCULAR | Status: DC
  Administered 2013-11-08: 3 mL via INTRAVENOUS

## 2013-11-07 MED ORDER — ASPIRIN 81 MG PO CHEW: 81.0000 mg | CHEWABLE_TABLET | ORAL | Status: DC

## 2013-11-07 NOTE — H&P (View-Only) (Signed)
 Patient Name: Linda [name] Date of Encounter: 11/07/2013     Principal Problem:   Chest pain Active Problems:   Hypertension   Hyperlipidemia, mixed   S/P CABG (coronary artery bypass graft)   CAD (coronary artery disease)   Bradycardia   COPD (chronic obstructive pulmonary disease)   TIA (transient ischemic attack)   PAD (peripheral artery disease)   Renal artery stenosis   Carotid artery disease    SUBJECTIVE  Had 1 episode of chest pain this am, no SOB. States prior to last July, her anginal symptom has always been throat pain. Last July, however, she had chest pain instead she had chest pain, which she received 2 stents. She had 1 episode of throat pain in [place]. However since she came back last Thur, her symptom has been intermittent CP at rest lasting 5-20 min. She has not tried nitroglycerine at home to see if they help because the symptom as very light. She has not exerted herself to see if it would get worse.   CURRENT MEDS . amLODipine  2.5 mg Oral Daily  . aspirin EC  81 mg Oral Daily  . atorvastatin  40 mg Oral Daily  . clopidogrel  75 mg Oral Daily  . docusate sodium  200 mg Oral QHS  . enoxaparin (LOVENOX) injection  40 mg Subcutaneous Daily  . gabapentin  100 mg Oral QHS  . hydrochlorothiazide  12.5 mg Oral Daily  . irbesartan  300 mg Oral Daily  . isosorbide mononitrate  60 mg Oral Daily  . metoprolol succinate  75 mg Oral Daily  . sodium chloride  3 mL Intravenous Q12H    OBJECTIVE  Filed Vitals:   11/06/13 2200 11/06/13 2216 11/06/13 2238 11/07/13 0531  BP: 156/79 142/53 150/67 97/46  Pulse: 56 56 52 45  Temp:   97.5 F (36.4 C) 97.6 F (36.4 C)  TempSrc:   Oral Oral  Resp: 19  18 18  Height:   5' 1" (1.549 m)   Weight:   137 lb 4.8 oz (62.279 kg) 137 lb 12.8 oz (62.506 kg)  SpO2: 96%  95% 92%    Intake/Output Summary (Last 24 hours) at 11/07/13 0951 Last data filed at 11/07/13 0538  Gross per 24 hour  Intake    363 ml  Output     350 ml  Net     13 ml   Filed Weights   11/06/13 1601 11/06/13 2238 11/07/13 0531  Weight: 143 lb (64.864 kg) 137 lb 4.8 oz (62.279 kg) 137 lb 12.8 oz (62.506 kg)    PHYSICAL EXAM  General: Pleasant, NAD. Neuro: Alert and oriented X 3. Moves all extremities spontaneously. Psych: Normal affect. HEENT:  Normal  Neck: Supple without bruits or JVD. Lungs:  Resp regular and unlabored, CTA. Heart: RRR no s3, s4, or murmurs. Abdomen: Soft, non-tender, non-distended, BS + x 4.  Extremities: No clubbing, cyanosis or edema. DP/PT/Radials 2+ and equal bilaterally.  Accessory Clinical Findings  CBC  Recent Labs  11/06/13 1627 11/07/13 0552  WBC 6.3 5.5  HGB 13.1 12.8  HCT 40.0 39.1  MCV 93.2 93.8  PLT 233 211   Basic Metabolic Panel  Recent Labs  11/06/13 1627 11/06/13 2325 11/07/13 0552  NA 141  --  143  K 4.4  --  4.5  CL 102  --  103  CO2 27  --  26  GLUCOSE 112*  --  84  BUN 21  --  22    CREATININE 0.87  --  1.03  CALCIUM 9.6  --  9.4  MG  --  2.1  --    Liver Function Tests  Recent Labs  11/07/13 0552  AST 17  ALT 14  ALKPHOS 38*  BILITOT 0.4  PROT 6.9  ALBUMIN 3.5   Cardiac Enzymes  Recent Labs  11/06/13 2325 11/07/13 0552  TROPONINI <0.30 <0.30   Fasting Lipid Panel  Recent Labs  11/07/13 0552  CHOL 130  HDL 45  LDLCALC 56  TRIG 143  CHOLHDL 2.9   Thyroid Function Tests  Recent Labs  11/06/13 2325  TSH 5.660*    TELE  NSR with HR 40-50s, no significant ventricular ectopy  ECG  Sinus brady with 1st degree heart block  Radiology/Studies  Dg Chest 2 View  11/06/2013   CLINICAL DATA:  COPD. Shortness breath. History of CABG. Hypertension. Nonsmoker.  EXAM: CHEST  2 VIEW  COMPARISON:  09/14/2013 CT and chest x-ray.  FINDINGS: No infiltrate, congestive heart failure or pneumothorax. The tiny nodule noted in the left upper lobe on prior chest CT is not evaluated by plain film.  Prominent coronary artery calcifications with stents  in place. Heart size within normal limits.  Calcified tortuous aorta.  IMPRESSION: No infiltrate, congestive heart failure or pneumothorax.  Prominent coronary artery calcifications with stents in place.  Calcified tortuous aorta.   Electronically Signed   By: Steve  Olson M.D.   On: 11/06/2013 16:47    ASSESSMENT AND PLAN  1. Throat pain: anginal equivalent  - troponin negative x 2  - ambulate the patient to see if can reproduce throat pain or CP  - discussed with Dr. Reyli Schroth, will plan for cath today   2. CAD s/p 3 v CABG 2002  - PCI to ostium of SVG to OM in 2003, DES to SVG to OM in 10/2012  - last cath 03/2013 severe native CAD, with 60% left main stenosis and patent LAD stents. 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 Patent SVG to RCA and SVG to OM.   - negative myoview 09/15/2013 EF 77%, no ischemia  3. HTN 4. Hyperlipidemia 5. PVD/carotid artery disease 6. Renal artery stenosis: 1-59% right RAS with no flow to left kidney by renal artery duplex in 09/2013.   Signed, Meng, Hao PA-C Pager: [phone] Patient examined.  Recent history reviewed.  Her history is concerning for unstable angina.  Her troponins are normal.  Her recent EKG shows slight increase in anteroseptal T-wave inversion when compared with May 2015.  Agree with plans for cardiac catheterization today.  Physical exam unremarkable.  Chest x-ray reviewed and shows prominent coronary artery calcifications.  

## 2013-11-07 NOTE — CV Procedure (Signed)
Cardiac Catheterization Procedure Note  Name: Linda [name] MRN: [id number] DOB: 06-18-1940  Procedure: Left Heart Cath, Selective Coronary Angiography, LV angiography, SVG angiography, FFR of the left main  Indication: Progressive angina. Pt with extensive CAD s/p remote CABG and multiple PCI procedures   Procedural Details: The left wrist was prepped, draped, and anesthetized with 1% lidocaine. Using the modified Seldinger technique, a 5/6 French sheath was introduced into the left radial artery. 3 mg of verapamil was administered through the sheath, weight-based unfractionated heparin was administered intravenously. Standard Judkins catheters were used for selective coronary angiography, SVG angiography, and left ventriculography. Catheter exchanges were performed over an exchange length guidewire. There were no immediate procedural complications. A TR band was used for radial hemostasis at the completion of the procedure.  The patient was transferred to the post catheterization recovery area for further monitoring.  Procedural Findings: Hemodynamics: AO 123/49 LV 122/6  Coronary angiography: Coronary dominance: right  Left mainstem: The left mainstem is moderately calcified. There is 50-60% distal left main stenosis. This is essentially unchanged from previous studies. There is heavy calcification with a hypodense appearance  Left anterior descending (LAD): The LAD is patent to the apex. There is a stented segment in the proximal LAD with minor diffuse in-stent restenosis. The LAD wraps around the left ventricular apex. There is no significant disease appreciated throughout the course of the LAD.  Left circumflex (LCx): The left circumflex is totally occluded proximally within a previously stented segment.  Right coronary artery (RCA): The RCA is severely calcified. The vessel is totally occluded just beyond its ostium.  LIMA to LAD: Not selectively injected. Known to be  atretic  SVG to OM: This vessel is patent. There is a long stented segment in the proximal body of the graft with mild 30% diffuse in-stent restenosis. Beyond the stented segment there is diffuse 40% stenosis. From the mid body of the graft to the distal insertion site, the graft is widely patent.  Saphenous vein graft to distal RCA: The graft is widely patent throughout. There is no significant disease noted. The RCA fills retrograde all the way back to the ostium where it is totally occluded. The PDA branches are patent. The RV marginal branches are patent.  Left ventriculography: Left ventricular systolic function is normal, LVEF is estimated at 65%, there is no significant mitral regurgitation   Following the diagnostic procedure, I elected to perform pressure wire analysis of the left main. The patient has a moderate hypodense distal left main stenosis and has had recurrent angina intermittently over the years. Additional unfractionated heparin was administered. The ACT was greater than 200. A 6 French JL 3.0 guide catheter was inserted. The pressure wire was normalized at the distal tip of the guide. Was advanced beyond the lesion. Intravenous adenosine was administered per protocol. The resting FFR was 0.94. The FFR at peak hyperemia was 0.87. The wire was removed and final angiography demonstrated stable coronary findings. The patient will be treated medically. She tolerated the procedure well. A TR band will be used for radial hemostasis.  Final Conclusions:   1. Three-vessel coronary artery disease with moderate stenosis of the distal left mainstem, continued patency of the LAD stented segment, total occlusion of the left circumflex, and total occlusion of the right coronary artery.  2. Continued patency of the saphenous vein graft RCA and saphenous vein graft to obtuse marginal  3. 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 Negative pressure wire analysis of  the left mainstem.  5. Normal LV  function with normal LVEDP  Recommendations: Continue current medical therapy. Anticipate hospital discharge tomorrow morning.  Linda BollmanMichael Kieley [name] 11/07/2013, 4:56 PM

## 2013-11-07 NOTE — Interval H&P Note (Signed)
History and Physical Interval Note:  11/07/2013 3:28 PM  Linda [name]  has presented today for surgery, with the diagnosis of cp  The various methods of treatment have been discussed with the patient and family. After consideration of risks, benefits and other options for treatment, the patient has consented to  Procedure(s): LEFT HEART CATHETERIZATION WITH CORONARY/GRAFT ANGIOGRAM (N/A) as a surgical intervention .  The patient's history has been reviewed, patient examined, no change in status, stable for surgery.  I have reviewed the patient's chart and labs.  Questions were answered to the patient's satisfaction.    Cath Lab Visit (complete for each Cath Lab visit)  Clinical Evaluation Leading to the Procedure:   ACS: Yes.    Non-ACS:    Anginal Classification: CCS IV  Anti-ischemic medical therapy: Maximal Therapy (2 or more classes of medications)  Non-Invasive Test Results: No non-invasive testing performed  Prior CABG: Previous CABG       Tonny BollmanMichael Palmina Clodfelter

## 2013-11-07 NOTE — Progress Notes (Signed)
Utilization review completed.  

## 2013-11-07 NOTE — Progress Notes (Signed)
Patient Name: Linda [name] Peace Date of Encounter: 11/07/2013     Principal Problem:   Chest pain Active Problems:   Hypertension   Hyperlipidemia, mixed   S/P CABG (coronary artery bypass graft)   CAD (coronary artery disease)   Bradycardia   COPD (chronic obstructive pulmonary disease)   TIA (transient ischemic attack)   PAD (peripheral artery disease)   Renal artery stenosis   Carotid artery disease    SUBJECTIVE  Had 1 episode of chest pain this am, no SOB. States prior to last July, her anginal symptom has always been throat pain. Last July, however, she had chest pain instead she had chest pain, which she received 2 stents. She had 1 episode of throat pain in [place]. However since she came back last Thur, her symptom has been intermittent CP at rest lasting 5-20 min. She has not tried nitroglycerine at home to see if they help because the symptom as very light. She has not exerted herself to see if it would get worse.   CURRENT MEDS . amLODipine  2.5 mg Oral Daily  . aspirin EC  81 mg Oral Daily  . atorvastatin  40 mg Oral Daily  . clopidogrel  75 mg Oral Daily  . docusate sodium  200 mg Oral QHS  . enoxaparin (LOVENOX) injection  40 mg Subcutaneous Daily  . gabapentin  100 mg Oral QHS  . hydrochlorothiazide  12.5 mg Oral Daily  . irbesartan  300 mg Oral Daily  . isosorbide mononitrate  60 mg Oral Daily  . metoprolol succinate  75 mg Oral Daily  . sodium chloride  3 mL Intravenous Q12H    OBJECTIVE  Filed Vitals:   11/06/13 2200 11/06/13 2216 11/06/13 2238 11/07/13 0531  BP: 156/79 142/53 150/67 97/46  Pulse: 56 56 52 45  Temp:   97.5 F (36.4 C) 97.6 F (36.4 C)  TempSrc:   Oral Oral  Resp: 19  18 18   Height:   5\' 1"  (1.549 m)   Weight:   137 lb 4.8 oz (62.279 kg) 137 lb 12.8 oz (62.506 kg)  SpO2: 96%  95% 92%    Intake/Output Summary (Last 24 hours) at 11/07/13 0951 Last data filed at 11/07/13 0538  Gross per 24 hour  Intake    363 ml  Output     350 ml  Net     13 ml   Filed Weights   11/06/13 1601 11/06/13 2238 11/07/13 0531  Weight: 143 lb (64.864 kg) 137 lb 4.8 oz (62.279 kg) 137 lb 12.8 oz (62.506 kg)    PHYSICAL EXAM  General: Pleasant, NAD. Neuro: Alert and oriented X 3. Moves all extremities spontaneously. Psych: Normal affect. HEENT:  Normal  Neck: Supple without bruits or JVD. Lungs:  Resp regular and unlabored, CTA. Heart: RRR no s3, s4, or murmurs. Abdomen: Soft, non-tender, non-distended, BS + x 4.  Extremities: No clubbing, cyanosis or edema. DP/PT/Radials 2+ and equal bilaterally.  Accessory Clinical Findings  CBC  Recent Labs  11/06/13 1627 11/07/13 0552  WBC 6.3 5.5  HGB 13.1 12.8  HCT 40.0 39.1  MCV 93.2 93.8  PLT 233 211   Basic Metabolic Panel  Recent Labs  11/06/13 1627 11/06/13 2325 11/07/13 0552  NA 141  --  143  K 4.4  --  4.5  CL 102  --  103  CO2 27  --  26  GLUCOSE 112*  --  84  BUN 21  --  22  CREATININE 0.87  --  1.03  CALCIUM 9.6  --  9.4  MG  --  2.1  --    Liver Function Tests  Recent Labs  11/07/13 0552  AST 17  ALT 14  ALKPHOS 38*  BILITOT 0.4  PROT 6.9  ALBUMIN 3.5   Cardiac Enzymes  Recent Labs  11/06/13 2325 11/07/13 0552  TROPONINI <0.30 <0.30   Fasting Lipid Panel  Recent Labs  11/07/13 0552  CHOL 130  HDL 45  LDLCALC 56  TRIG 143  CHOLHDL 2.9   Thyroid Function Tests  Recent Labs  11/06/13 2325  TSH 5.660*    TELE  NSR with HR 40-50s, no significant ventricular ectopy  ECG  Sinus brady with 1st degree heart block  Radiology/Studies  Dg Chest 2 View  11/06/2013   CLINICAL DATA:  COPD. Shortness breath. History of CABG. Hypertension. Nonsmoker.  EXAM: CHEST  2 VIEW  COMPARISON:  09/14/2013 CT and chest x-ray.  FINDINGS: No infiltrate, congestive heart failure or pneumothorax. The tiny nodule noted in the left upper lobe on prior chest CT is not evaluated by plain film.  Prominent coronary artery calcifications with stents  in place. Heart size within normal limits.  Calcified tortuous aorta.  IMPRESSION: No infiltrate, congestive heart failure or pneumothorax.  Prominent coronary artery calcifications with stents in place.  Calcified tortuous aorta.   Electronically Signed   By: Bridgett LarssonSteve  Olson M.D.   On: 11/06/2013 16:47    ASSESSMENT AND PLAN  1. Throat pain: anginal equivalent  - troponin negative x 2  - ambulate the patient to see if can reproduce throat pain or CP  - discussed with Dr. Patty SermonsBrackbill, will plan for cath today   2. CAD s/p 3 v CABG 2002  - PCI to ostium of SVG to OM in 2003, DES to SVG to OM in 10/2012  - last cath 03/2013 severe native CAD, with 60% left main stenosis and patent LAD stents. 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 Patent SVG to RCA and SVG to OM.   - negative myoview 09/15/2013 EF 77%, no ischemia  3. HTN 4. Hyperlipidemia 5. PVD/carotid artery disease 6. Renal artery stenosis: 1-59% right RAS with no flow to left kidney by renal artery duplex in 09/2013.   Ramond DialSigned, Meng, Hao PA-C Pager: [phone] Patient examined.  Recent history reviewed.  Her history is concerning for unstable angina.  Her troponins are normal.  Her recent EKG shows slight increase in anteroseptal T-wave inversion when compared with May 2015.  Agree with plans for cardiac catheterization today.  Physical exam unremarkable.  Chest x-ray reviewed and shows prominent coronary artery calcifications.

## 2013-11-08 ENCOUNTER — Encounter (HOSPITAL_COMMUNITY): Payer: Self-pay | Admitting: Physician Assistant

## 2013-11-08 DIAGNOSIS — I701 Atherosclerosis of renal artery: Secondary | ICD-10-CM | POA: Diagnosis not present

## 2013-11-08 DIAGNOSIS — I2510 Atherosclerotic heart disease of native coronary artery without angina pectoris: Secondary | ICD-10-CM | POA: Diagnosis not present

## 2013-11-08 DIAGNOSIS — I200 Unstable angina: Secondary | ICD-10-CM | POA: Diagnosis not present

## 2013-11-08 DIAGNOSIS — I2582 Chronic total occlusion of coronary artery: Secondary | ICD-10-CM | POA: Diagnosis not present

## 2013-11-08 LAB — BASIC METABOLIC PANEL
Anion gap: 13 (ref 5–15)
BUN: 20 mg/dL (ref 6–23)
CALCIUM: 8.8 mg/dL (ref 8.4–10.5)
CO2: 27 meq/L (ref 19–32)
CREATININE: 0.97 mg/dL (ref 0.50–1.10)
Chloride: 100 mEq/L (ref 96–112)
GFR calc Af Amer: 67 mL/min — ABNORMAL LOW (ref >90)
GFR calc non Af Amer: 57 mL/min — ABNORMAL LOW (ref >90)
Glucose, Bld: 130 mg/dL — ABNORMAL HIGH (ref 70–99)
Potassium: 4.1 mEq/L (ref 3.7–5.3)
SODIUM: 140 meq/L (ref 137–147)

## 2013-11-08 NOTE — Progress Notes (Signed)
Patient: Linda [name] / Admit Date: 11/06/2013 / Date of Encounter: 11/08/2013, 8:07 AM   Subjective: No CP or SOB. Feels good. Has been ambulating up in room without problem.   Objective: Telemetry: NSR Physical Exam: Blood pressure 108/48, pulse 58, temperature 98.2 F (36.8 C), temperature source Oral, resp. rate 16, height 5\' 1"  (1.549 m), weight 137 lb 11.2 oz (62.46 kg), SpO2 90.00%. General: Well developed, well nourished WF in no acute distress. Head: Normocephalic, atraumatic, sclera non-icteric, no xanthomas, nares are without discharge. Neck: JVP not elevated. Lungs: Clear bilaterally to auscultation without wheezes, rales, or rhonchi. Breathing is unlabored. Heart: RRR S1 S2 without murmurs, rubs, or gallops.  Abdomen: Soft, non-tender, non-distended with normoactive bowel sounds. No rebound/guarding. Extremities: No clubbing or cyanosis. No edema. Distal pedal pulses are 2+ and equal bilaterally. L radial cath site with soft ecchymosis, good pulse in tact Neuro: Alert and oriented X 3. Moves all extremities spontaneously. Psych:  Responds to questions appropriately with a normal affect.   Intake/Output Summary (Last 24 hours) at 11/08/13 1610 Last data filed at 11/07/13 2120  Gross per 24 hour  Intake      0 ml  Output    250 ml  Net   -250 ml    Inpatient Medications:  . amLODipine  2.5 mg Oral Daily  . aspirin EC  81 mg Oral Daily  . atorvastatin  40 mg Oral Daily  . clopidogrel  75 mg Oral Daily  . docusate sodium  200 mg Oral QHS  . enoxaparin (LOVENOX) injection  40 mg Subcutaneous Daily  . gabapentin  100 mg Oral QHS  . hydrochlorothiazide  12.5 mg Oral Daily  . irbesartan  300 mg Oral Daily  . isosorbide mononitrate  60 mg Oral Daily  . metoprolol succinate  75 mg Oral Daily  . sodium chloride  3 mL Intravenous Q12H  . sodium chloride  3 mL Intravenous Q12H   Infusions:    Labs:  Recent Labs  11/06/13 1627 11/06/13 2325 11/07/13 0552  NA  141  --  143  K 4.4  --  4.5  CL 102  --  103  CO2 27  --  26  GLUCOSE 112*  --  84  BUN 21  --  22  CREATININE 0.87  --  1.03  CALCIUM 9.6  --  9.4  MG  --  2.1  --     Recent Labs  11/07/13 0552  AST 17  ALT 14  ALKPHOS 38*  BILITOT 0.4  PROT 6.9  ALBUMIN 3.5    Recent Labs  11/06/13 1627 11/07/13 0552  WBC 6.3 5.5  HGB 13.1 12.8  HCT 40.0 39.1  MCV 93.2 93.8  PLT 233 211    Recent Labs  11/06/13 2325 11/07/13 0552 11/07/13 0954  TROPONINI <0.30 <0.30 <0.30   No components found with this basename: POCBNP,   Recent Labs  11/06/13 2325  HGBA1C 6.0*     Radiology/Studies:  Dg Chest 2 View  11/06/2013   CLINICAL DATA:  COPD. Shortness breath. History of CABG. Hypertension. Nonsmoker.  EXAM: CHEST  2 VIEW  COMPARISON:  09/14/2013 CT and chest x-ray.  FINDINGS: No infiltrate, congestive heart failure or pneumothorax. The tiny nodule noted in the left upper lobe on prior chest CT is not evaluated by plain film.  Prominent coronary artery calcifications with stents in place. Heart size within normal limits.  Calcified tortuous aorta.  IMPRESSION: No infiltrate, congestive heart failure  or pneumothorax.  Prominent coronary artery calcifications with stents in place.  Calcified tortuous aorta.   Electronically Signed   By: Bridgett LarssonSteve  Olson M.D.   On: 11/06/2013 16:47     Assessment and Plan  1. CAD s/p prior CABG and multiple PCIs - cath 11/07/13 for medical therapy (moderate stenosis of the distal left mainstem - negative FFR, continued patency of the LAD stented segment, total occlusion of the left circumflex, and total occlusion of the right coronary artery, Continued patency of the saphenous vein graft RCA and saphenous vein graft to obtuse marginal, known atretic LIMA-LAD), normal LV function/LVEDP 2. HTN 3. Hyperlipidemia 4. PVD s/p RCIA stent 2002 5. Renal artery stenosis: 1-59% right RAS with no flow to left kidney by renal artery duplex in 09/2013  With softer  BP's, will discontinue HCTZ. BP range 90s-150s this admission. Home BP's running 105-110 per pt. Will discuss with MD whether decrease of another agent is necessary as well. She monitors BP at home. Cath site precautions reviewed. Otherwise continue current medical therapy.  Given #5, repeat BMET this AM to ensure stability of Cr. If OK, can likely DC this AM.  Signed, Ronie Spiesayna Jevante Hollibaugh PA-C

## 2013-11-08 NOTE — Progress Notes (Signed)
Utilization review completed.  

## 2013-11-08 NOTE — Telephone Encounter (Signed)
Linda [name].  We need to have the team see this patient while I am away. Please arrange for [place] to be [place] soon  [place]  (whichever is best for her and the team)  to reassess her status. Please do this unless she is seen soon by her primary with the issues resolved.

## 2013-11-08 NOTE — Discharge Summary (Signed)
Discharge Summary   Patient ID: Linda [name] MRN: [id number], DOB/AGE: 1941-01-02 73 y.o. Admit date: 11/06/2013 D/C date:     11/08/2013  Primary Care Provider: Kirstie Peri, MD Primary Cardiologist: Myrtis Ser in [place] - no appts til August  Primary Discharge Diagnoses:  1. CAD s/p prior CABG and multiple PCIs - cath 11/07/13 for medical therapy (moderate stenosis of the distal left mainstem - negative FFR, continued patency of the LAD stented segment, total occlusion of the left circumflex, and total occlusion of the right coronary artery, Continued patency of the saphenous vein graft RCA and saphenous vein graft to obtuse marginal, known atretic LIMA-LAD), normal LV function/LVEDP  2. HTN  3. Hyperlipidemia  4. Renal artery stenosis: 1-59% right RAS with no flow to left kidney by renal artery duplex in 09/2013  Secondary Discharge Diagnoses:  1. H/o bradycardia per chart 2. H/o TIAs 1990 3. H/o L shoulder pain 4. PVD s/p RCIA stent 2012  Hospital Course: Linda [name] is a very pleasant 73 year old female with a history of CAD s/p CABG x3 in 2002 s/p PCI of the ostium of the SVG to the OM in 2003 and PCI with DES to the SVG to OM in July 2014. She also has a history of hypertension, hyperlipidemia, bradycardia, peripheral artery disease status post stent to the right common iliac artery in 2012, TIA, renal artery stenosis, and carotid artery disease. Her last cardiac catheterization was in December 2014 which showed severe native CAD, with 60% left main stenosis and patent LAD stents. The LIMA is known to be atretic from previous caths, patent SVG to RCA and SVG to OM. LV function by nuclear MPI in 08/2013 was normal, calculated LVEF 77%. There was no evidence of ischemia or scar, with mild anteroapical soft tissue attenuation noted.   She had been visiting her granddaughter and great granddaughter in [place] last week. Symptoms began before she traveled there. She began experieincing intermittent  throat discomfort. She says that prior to CABG and subsequent stents, her primary symptom was throat pain. On one particular occasion it waxed and waned over 2 hours and then dissipated without the use of SL nitro. Upon returning home, due to increasing frequency of symptoms as well as some chest pressure and very mild SOB, she presented to the hospital for further evaluation. She denied palpitations, leg swelling, orthopnea, and paroxysmal nocturnal dyspnea. She did not have any signs of DVT on exam. She was not tachycardic or tachypnic. There was no chest pain on inspiration reported. Troponins were all negative. CXR showed No infiltrate, congestive heart failure or pneumothorax. She underwent cardiac cath on 11/07/13 with the above findings. Continued medical therapy was recommended. TSH was mildly elevated at 5.6 but f/u free T4 was normal. As blood pressure was on softer side, 80s-low 100s occasionally into 150s, amlodipine and HCTZ were held at discharge. The patient monitors her BP at home twice daily and will call us if she continues to have low readings. Today she feels very well. She denies any CP or SOB and has not had any symptoms on ambulation. Dr. Patty Sermons has seen and examined the patient today and feels she is stable for discharge.  Due to Dr. Henrietta Hoover appointment availability not until end of August, she will follow up with Dr. Purvis Sheffield for her post-hospital appointment on 7/30 at 11:40am then resume normal scheduling. She was instructed to contact office closer to her appt date to verify address since the practice is tentatively moving at end of  July.    Discharge Vitals: Blood pressure 92/62, pulse 58, temperature 98.2 F (36.8 C), temperature source Oral, resp. rate 16, height 5\' 1"  (1.549 m), weight 137 lb 11.2 oz (62.46 kg), SpO2 94.00%.  Labs: Lab Results  Component Value Date   WBC 5.5 11/07/2013   HGB 12.8 11/07/2013   HCT 39.1 11/07/2013   MCV 93.8 11/07/2013   PLT 211 11/07/2013      Recent Labs Lab 11/07/13 0552 11/08/13 0830  NA 143 140  K 4.5 4.1  CL 103 100  CO2 26 27  BUN 22 20  CREATININE 1.03 0.97  CALCIUM 9.4 8.8  PROT 6.9  --   BILITOT 0.4  --   ALKPHOS 38*  --   ALT 14  --   AST 17  --   GLUCOSE 84 130*    Recent Labs  11/06/13 2325 11/07/13 0552 11/07/13 0954  TROPONINI <0.30 <0.30 <0.30   Lab Results  Component Value Date   CHOL 130 11/07/2013   HDL 45 11/07/2013   LDLCALC 56 11/07/2013   TRIG 143 11/07/2013   No results found for this basename: DDIMER    Diagnostic Studies/Procedures   Dg Chest 2 View 11/06/2013   CLINICAL DATA:  COPD. Shortness breath. History of CABG. Hypertension. Nonsmoker.  EXAM: CHEST  2 VIEW  COMPARISON:  09/14/2013 CT and chest x-ray.  FINDINGS: No infiltrate, congestive heart failure or pneumothorax. The tiny nodule noted in the left upper lobe on prior chest CT is not evaluated by plain film.  Prominent coronary artery calcifications with stents in place. Heart size within normal limits.  Calcified tortuous aorta.  IMPRESSION: No infiltrate, congestive heart failure or pneumothorax.  Prominent coronary artery calcifications with stents in place.  Calcified tortuous aorta.   Electronically Signed   By: Bridgett LarssonSteve  Olson M.D.   On: 11/06/2013 16:47   Cath 11/07/13 Cardiac Catheterization Procedure Note  Name: Linda [name]  MRN: [id number]  DOB: 09/03/1940  Procedure: Left Heart Cath, Selective Coronary Angiography, LV angiography, SVG angiography, FFR of the left main  Indication: Progressive angina. Pt with extensive CAD s/p remote CABG and multiple PCI procedures  Procedural Details: The left wrist was prepped, draped, and anesthetized with 1% lidocaine. Using the modified Seldinger technique, a 5/6 French sheath was introduced into the left radial artery. 3 mg of verapamil was administered through the sheath, weight-based unfractionated heparin was administered intravenously. Standard Judkins catheters were used  for selective coronary angiography, SVG angiography, and left ventriculography. Catheter exchanges were performed over an exchange length guidewire. There were no immediate procedural complications. A TR band was used for radial hemostasis at the completion of the procedure. The patient was transferred to the post catheterization recovery area for further monitoring.  Procedural Findings:  Hemodynamics:  AO 123/49  LV 122/6  Coronary angiography:  Coronary dominance: right  Left mainstem: The left mainstem is moderately calcified. There is 50-60% distal left main stenosis. This is essentially unchanged from previous studies. There is heavy calcification with a hypodense appearance  Left anterior descending (LAD): The LAD is patent to the apex. There is a stented segment in the proximal LAD with minor diffuse in-stent restenosis. The LAD wraps around the left ventricular apex. There is no significant disease appreciated throughout the course of the LAD.  Left circumflex (LCx): The left circumflex is totally occluded proximally within a previously stented segment.  Right coronary artery (RCA): The RCA is severely calcified. The vessel is totally occluded just  beyond its ostium.  LIMA to LAD: Not selectively injected. Known to be atretic  SVG to OM: This vessel is patent. There is a long stented segment in the proximal body of the graft with mild 30% diffuse in-stent restenosis. Beyond the stented segment there is diffuse 40% stenosis. From the mid body of the graft to the distal insertion site, the graft is widely patent.  Saphenous vein graft to distal RCA: The graft is widely patent throughout. There is no significant disease noted. The RCA fills retrograde all the way back to the ostium where it is totally occluded. The PDA branches are patent. The RV marginal branches are patent.  Left ventriculography: Left ventricular systolic function is normal, LVEF is estimated at 65%, there is no significant  mitral regurgitation  Following the diagnostic procedure, I elected to perform pressure wire analysis of the left main. The patient has a moderate hypodense distal left main stenosis and has had recurrent angina intermittently over the years. Additional unfractionated heparin was administered. The ACT was greater than 200. A 6 French JL 3.0 guide catheter was inserted. The pressure wire was normalized at the distal tip of the guide. Was advanced beyond the lesion. Intravenous adenosine was administered per protocol. The resting FFR was 0.94. The FFR at peak hyperemia was 0.87. The wire was removed and final angiography demonstrated stable coronary findings. The patient will be treated medically. She tolerated the procedure well. A TR band will be used for radial hemostasis.  Final Conclusions:  1. Three-vessel coronary artery disease with moderate stenosis of the distal left mainstem, continued patency of the LAD stented segment, total occlusion of the left circumflex, and total occlusion of the right coronary artery.  2. Continued patency of the saphenous vein graft RCA and saphenous vein graft to obtuse marginal  3. 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 Negative pressure wire analysis of the left mainstem.  5. Normal LV function with normal LVEDP  Recommendations: Continue current medical therapy. Anticipate hospital discharge tomorrow morning.    Discharge Medications   Current Discharge Medication List    CONTINUE these medications which have NOT CHANGED   Details  aspirin 81 MG tablet Take 81 mg by mouth daily.      atorvastatin (LIPITOR) 40 MG tablet Take 40 mg by mouth daily.    clopidogrel (PLAVIX) 75 MG tablet Take 75 mg by mouth daily.    docusate sodium (COLACE) 100 MG capsule Take 200 mg by mouth at bedtime.     gabapentin (NEURONTIN) 100 MG capsule Take 100 mg by mouth at bedtime.    isosorbide mononitrate (IMDUR) 60 MG 24 hr tablet Take 60 mg by mouth daily.     metoprolol  (TOPROL-XL) 50 MG 24 hr tablet Take 75 mg by mouth daily.     nitroGLYCERIN (NITROSTAT) 0.4 MG SL tablet Place 1 tablet (0.4 mg total) under the tongue every 5 (five) minutes as needed. For chest pain     olmesartan (BENICAR) 40 MG tablet Take 40 mg by mouth daily.    Omega-3 Fatty Acids (FISH OIL) 1000 MG CAPS Take 2 capsules (2,000 mg total) by mouth 2 (two) times daily.      STOP taking these medications     amLODipine (NORVASC) 5 MG tablet      hydrochlorothiazide (MICROZIDE) 12.5 MG capsule         Disposition   The patient will be discharged in stable condition to home. Discharge Instructions   Diet -  low sodium heart healthy    Complete by:  As directed      Increase activity slowly    Complete by:  As directed   No driving for 2 days. No lifting over 5 lbs for 1 week. No sexual activity for 1 week. Keep procedure site clean & dry. If you notice increased pain, swelling, bleeding or pus, call/return!  You may shower, but no soaking baths/hot tubs/pools for 1 week.   If symptoms recur, please return for further evaluation.  We have stopped your Amlodipine and Hydrochlorothiazide.          Follow-up Information   Follow up with Prentice Docker, MD. (Dr. Myrtis Ser doesn't have any appointments until end of August which is too far out. You'll see Dr. Purvis Sheffield in our [place] office on 11/22/13 at 11:40am then see Dr. Myrtis Ser again for further followup.)    Contact information:   [address] [place] [address] [phone] !!!!!!! HOWEVER - office is MOVING. The tentative move date is 11/16/13 but this could change. Before your appointment, please contact the office to verify WHAT ADDRESS you need to go to.        Duration of Discharge Encounter: Greater than 30 minutes including physician and PA time.  Signed, Dayna Dunn PA-C 11/08/2013, 10:14 AM

## 2013-11-09 ENCOUNTER — Encounter (HOSPITAL_COMMUNITY): Payer: Self-pay | Admitting: Emergency Medicine

## 2013-11-09 ENCOUNTER — Emergency Department (HOSPITAL_COMMUNITY)
Admission: EM | Admit: 2013-11-09 | Discharge: 2013-11-09 | Disposition: A | Discharge status: Home / Self Care | Payer: Medicare Other | Attending: Emergency Medicine | Admitting: Emergency Medicine

## 2013-11-09 ENCOUNTER — Telehealth: Payer: Self-pay | Admitting: *Deleted

## 2013-11-09 DIAGNOSIS — M25539 Pain in unspecified wrist: Secondary | ICD-10-CM | POA: Insufficient documentation

## 2013-11-09 DIAGNOSIS — E782 Mixed hyperlipidemia: Secondary | ICD-10-CM | POA: Insufficient documentation

## 2013-11-09 DIAGNOSIS — Z9861 Coronary angioplasty status: Secondary | ICD-10-CM | POA: Diagnosis not present

## 2013-11-09 DIAGNOSIS — Z87891 Personal history of nicotine dependence: Secondary | ICD-10-CM | POA: Diagnosis not present

## 2013-11-09 DIAGNOSIS — I2510 Atherosclerotic heart disease of native coronary artery without angina pectoris: Secondary | ICD-10-CM | POA: Diagnosis not present

## 2013-11-09 DIAGNOSIS — Z7902 Long term (current) use of antithrombotics/antiplatelets: Secondary | ICD-10-CM | POA: Diagnosis not present

## 2013-11-09 DIAGNOSIS — Z79899 Other long term (current) drug therapy: Secondary | ICD-10-CM | POA: Insufficient documentation

## 2013-11-09 DIAGNOSIS — Z9889 Other specified postprocedural states: Secondary | ICD-10-CM | POA: Diagnosis not present

## 2013-11-09 DIAGNOSIS — Z7982 Long term (current) use of aspirin: Secondary | ICD-10-CM | POA: Insufficient documentation

## 2013-11-09 DIAGNOSIS — Z8673 Personal history of transient ischemic attack (TIA), and cerebral infarction without residual deficits: Secondary | ICD-10-CM | POA: Insufficient documentation

## 2013-11-09 DIAGNOSIS — M25532 Pain in left wrist: Secondary | ICD-10-CM

## 2013-11-09 DIAGNOSIS — I10 Essential (primary) hypertension: Secondary | ICD-10-CM | POA: Insufficient documentation

## 2013-11-09 DIAGNOSIS — IMO0002 Reserved for concepts with insufficient information to code with codable children: Secondary | ICD-10-CM

## 2013-11-09 DIAGNOSIS — Z951 Presence of aortocoronary bypass graft: Secondary | ICD-10-CM | POA: Diagnosis not present

## 2013-11-09 NOTE — Consult Note (Signed)
Brief consult note:  73 yo female with PMH sig for CAD s/p CABG who was discharged yesterday after a cath on 7/15 after having possible ischemic symptoms presenting on 7/14. Cath performed via left radial, no complications. Post cath had some mild bruising and swelling at the site and was told by the discharging PA to keep an eye on it. Bruising worsened prompting a call to the office who recommended she seek urgent care leading to this ER visit.   She denies pain, loss of sensation, cold fingers, discolor fingers. Intermittent tingling that is rare. No bleeding No other bleeding or easy bruising elsewhere. On aspirin and plavix. No weakness.  BP 143/52  Pulse 69  Temp(Src) 98 F (36.7 C)  Resp 19  Ht 5\' 1"  (1.549 m)  Wt 62.143 kg (137 lb)  BMI 25.90 kg/m2  SpO2 98% Gen: no distress CV: RRR, no m/r/g Lungs: clear Ext: left forearm with bruising extending near elbow with mild, non-taut swelling. No painful. Excellent radial pulse. Pulse ox with great waveform on index and pinkie.  A/P: Bruising post cath. No evidence of serious complications. Okay to discharge home. Reviewed indications to urgently return (loss of sensation, tight swelling, painful fingers, cold/blue fingers). Wrapped loosely with coban to help resorbed hematoma swelling.  CAD: Continue current medications.  Follow up as previously scheduled.  FYI to Dr. Excell Seltzerooper.

## 2013-11-09 NOTE — ED Notes (Signed)
MD at bedside. 

## 2013-11-09 NOTE — ED Notes (Addendum)
Left arm with increasing blue bruising on forearm. Pulses intact. Pt reports minimal pain. Pt denies chest pain or SOB.

## 2013-11-09 NOTE — Telephone Encounter (Signed)
Patient called to inform office that at her cath site of left arm, the bruising has started spreading and she was instructed at d/c to contact the office if the bruising began to spread. Patient said the spreading started in the last 2 hours and it looks a little puffy. Patient said when she woke up this morning the bruising wasn't spreading. No pain, no redness. No visible drainage.

## 2013-11-09 NOTE — ED Notes (Signed)
Per cardiology who is at bedside; pt's arm wrapped with light pressure in 2 inch coban. Pt instructed to remove it uncomfortable, loss of sensation, change in color of hand, or tingling.

## 2013-11-09 NOTE — ED Notes (Signed)
PT ambulated with baseline gait; VSS; A&Ox3; no signs of distress; respirations even and unlabored; skin warm and dry; no questions upon discharge.  

## 2013-11-09 NOTE — Telephone Encounter (Signed)
Please call patient's cell # [phone]

## 2013-11-09 NOTE — Telephone Encounter (Signed)
Spoke with patient and she informed nurse that the swelling and the bruising was worse than earlier. Nurse advised patient that she needed to go to the ED for an evaluation.

## 2013-11-09 NOTE — ED Notes (Signed)
The pt  Had a cath performed 2 days ago.  She has had more bruising and swelling today.   Good radial and brachial pulses.  She is on blood thinners lovenox 2 days ago.  She is also on plavix

## 2013-11-09 NOTE — Telephone Encounter (Signed)
I'm not here next week, and K. Lawrence NP is also off. Would have her see a PA in [place] on Monday to have it evaluated, or be seen in ED if continues to progress.

## 2013-11-09 NOTE — Telephone Encounter (Signed)
Spoke with Beckie BusingLydia RN from [place] office. She instructed pt to go to [facility] as bruising and swelling was getting worse. I agree with this plan.

## 2013-11-09 NOTE — ED Provider Notes (Signed)
CSN: [id number]     Arrival date & time 11/09/13  1740 History   First MD Initiated Contact with Patient 11/09/13 2017     Chief Complaint  Patient presents with  . Arm Pain      HPI Patient presents emergency department with ongoing left wrist pain since her heart catheterization 2 days ago.  She reports bruising in this area.  She call the cardiology office never recommended that she come to the ER for evaluation.  Patient denies numbness or tingling in her left hand.  No other complaints at this time.  Symptoms are mild in severity.   Past Medical History  Diagnosis Date  . Hypertension     Unspecified   . Hyperlipidemia, mixed   . CAD (coronary artery disease)     a. s/p CABG x3v (2002); NSTEMI s/p PCI of the ostium of SVG to OM (2003); PCI with DES of SVG to OM (10/2012). b. Cath 11/07/13 - for medical therapy. Negative FFR of LM, see cath report otherwise.  . Bradycardia   . PAD (peripheral artery disease)     a. s/p stent to right common iliac artery (2012)  . TIA (transient ischemic attack) 1990's X 2-3  . Claudication   . Renal artery stenosis     95% stenosis  known from the past., Small left kidney, medical therapy  . Carotid artery disease     November, 2013  //   Doppler, March, 2014, stable, 0-39% bilateral, followup 2 years  . Shoulder pain, left    Past Surgical History  Procedure Laterality Date  . Coronary artery bypass graft  03/2001    "CABG X3"   . Cardiac catheterization      "several; last one was 04/18/2013" (09/14/2013)  . Coronary angioplasty with stent placement      "counting the 2 they put in 10/2012 I've got 6 or 7 stents total"  . Knee surgery Left ~ 2004    "shattered; left me w/ 1/2 of a kneecap"   . Tonsillectomy and adenoidectomy  ~ 1948  . Cataract extraction w/ intraocular lens  implant, bilateral Bilateral 2015  . Iliac artery stent Right 2012  . Dilation and curettage of uterus  1964    S/P miscarriage   Family History  Problem  Relation Age of Onset  . Coronary artery disease    . Heart attack Mother   . Heart failure Father   . Cancer Mother   . Heart failure Maternal Grandmother   . Diabetes Maternal Grandmother   . Kidney disease Paternal Grandfather    History  Substance Use Topics  . Smoking status: Former Smoker -- 1.00 packs/day for 35 years    Types: Cigarettes    Quit date: 03/30/2001  . Smokeless tobacco: Former NeurosurgeonUser    Types: Snuff  . Alcohol Use: No   OB History   Grav Para Term Preterm Abortions TAB SAB Ect Mult Living                 Review of Systems  All other systems reviewed and are negative.     Allergies  Sulfonamide derivatives  Home Medications   Prior to Admission medications   Medication Sig Start Date End Date Taking? Authorizing Provider  aspirin 81 MG tablet Take 81 mg by mouth daily.     Yes Historical Provider, MD  atorvastatin (LIPITOR) 40 MG tablet Take 40 mg by mouth daily.   Yes Historical Provider, MD  clopidogrel (PLAVIX)  75 MG tablet Take 75 mg by mouth daily.   Yes Historical Provider, MD  docusate sodium (COLACE) 100 MG capsule Take 200 mg by mouth at bedtime.    Yes Historical Provider, MD  gabapentin (NEURONTIN) 100 MG capsule Take 100 mg by mouth at bedtime.   Yes Historical Provider, MD  isosorbide mononitrate (IMDUR) 60 MG 24 hr tablet Take 60 mg by mouth daily.    Yes Historical Provider, MD  metoprolol (TOPROL-XL) 50 MG 24 hr tablet Take 75 mg by mouth daily.    Yes Historical Provider, MD  nitroGLYCERIN (NITROSTAT) 0.4 MG SL tablet Place 1 tablet (0.4 mg total) under the tongue every 5 (five) minutes as needed. For chest pain 11/16/12  Yes Rhonda G Barrett, PA-C  olmesartan (BENICAR) 40 MG tablet Take 40 mg by mouth daily.   Yes Historical Provider, MD  Omega-3 Fatty Acids (FISH OIL) 1000 MG CAPS Take 2 capsules (2,000 mg total) by mouth 2 (two) times daily. 04/18/13  Yes Thereasa Parkin, PA-C   BP 143/52  Pulse 69  Temp(Src) 98 F (36.7 C)  Resp  19  Ht 5\' 1"  (1.549 m)  Wt 137 lb (62.143 kg)  BMI 25.90 kg/m2  SpO2 98% Physical Exam  Nursing note and vitals reviewed. Constitutional: She is oriented to person, place, and time. She appears well-developed and well-nourished.  HENT:  Head: Normocephalic.  Eyes: EOM are normal.  Neck: Normal range of motion.  Pulmonary/Chest: Effort normal.  Abdominal: She exhibits no distension.  Musculoskeletal: Normal range of motion.  Bruising of left anterior wrist and ongoing bruising to the level of the proximal forearm.  Normal left radial pulse.  Normal perfusion to her distal fingertips.  No erythema or redness.  Neurological: She is alert and oriented to person, place, and time.  Psychiatric: She has a normal mood and affect.    ED Course  Procedures (including critical care time) Labs Review Labs Reviewed - No data to display  Imaging Review No results found.   EKG Interpretation None      MDM   Final diagnoses:  Left wrist pain    Her left wrist looks pretty good to me.  I asked cardiology to come down and evaluate the patient they agreed that the patient can be discharged safely home from the emergency department.    Lyanne Co, MD 11/09/13 2128

## 2013-11-09 NOTE — Progress Notes (Signed)
Pt complaining of tingling and discomfort on right wrist and hand. On exam there is some swelling and bruise. Dr. Patty SermonsBrackbill came and assessed pt , he gave ok to discharge pt today.  Colleen Canesar Jalaysia Lobb, RN

## 2013-11-12 NOTE — ED Provider Notes (Signed)
I saw and evaluated the patient, reviewed the resident's note and I agree with the findings and plan.   EKG Interpretation   Date/Time:  Tuesday November 06 2013 15:58:11 EDT Ventricular Rate:  69 PR Interval:  186 QRS Duration: 90 QT Interval:  396 QTC Calculation: 424 R Axis:   117 Text Interpretation:  Normal sinus rhythm Left posterior fascicular block  T wave abnormality, consider anterior ischemia Abnormal ECG Confirmed by  Rhunette CroftNANAVATI, MD, Janey GentaANKIT [id number]) on 11/06/2013 5:37:36 PM      PT with atypical chest pain and hx of CAD. Intermittent chest pain as of late - which has been new for the patient. Cards consulted given her hx. She is currently chest pain free.   Derwood KaplanAnkit Janetta Vandoren, MD 11/12/13 0040

## 2013-11-15 ENCOUNTER — Telehealth: Payer: Self-pay | Admitting: Cardiology

## 2013-11-15 MED ORDER — AMLODIPINE BESYLATE 5 MG PO TABS: 5.0000 mg | ORAL_TABLET | Freq: Every day | ORAL | Status: DC

## 2013-11-15 NOTE — Telephone Encounter (Signed)
Per Dr Patty SermonsBrackbill the pt is advised to resume Amlodipine 5 mg daily. Linda [name] verbalized understanding and thanked me for my assistance.

## 2013-11-15 NOTE — Telephone Encounter (Signed)
New message          C/o bp 176/73 / pt would like for Linda [name] to give her a call with instructions / Medications were changed by Brackbill when she was in the hospital / What shall I do?

## 2013-11-15 NOTE — Telephone Encounter (Addendum)
The pt was discharged from the hospital on 7/16 and she states she was advised by Dr Patty SermonsBrackbill to stop taking Amlodipine 5 mg daily and HCTZ 12.5 mg daily due to low BP at discharge of 92/62. The pt states that her BP has been gradually going up since then. She states that her BP this am at 11:00 (2 hours after taking her meds) was 176/73. She states that she has a slight headache at this time. She wants to know what to do.

## 2013-11-22 ENCOUNTER — Ambulatory Visit (INDEPENDENT_AMBULATORY_CARE_PROVIDER_SITE_OTHER): Payer: Medicare Other | Admitting: Cardiovascular Disease

## 2013-11-22 ENCOUNTER — Encounter: Payer: Self-pay | Admitting: Cardiovascular Disease

## 2013-11-22 VITALS — BP 164/78 | HR 58 | Ht 61.0 in | Wt 142.0 lb

## 2013-11-22 DIAGNOSIS — Z87898 Personal history of other specified conditions: Secondary | ICD-10-CM

## 2013-11-22 DIAGNOSIS — I10 Essential (primary) hypertension: Secondary | ICD-10-CM

## 2013-11-22 DIAGNOSIS — Z9289 Personal history of other medical treatment: Secondary | ICD-10-CM

## 2013-11-22 DIAGNOSIS — E782 Mixed hyperlipidemia: Secondary | ICD-10-CM

## 2013-11-22 DIAGNOSIS — I739 Peripheral vascular disease, unspecified: Secondary | ICD-10-CM

## 2013-11-22 DIAGNOSIS — I701 Atherosclerosis of renal artery: Secondary | ICD-10-CM

## 2013-11-22 DIAGNOSIS — I25810 Atherosclerosis of coronary artery bypass graft(s) without angina pectoris: Secondary | ICD-10-CM

## 2013-11-22 DIAGNOSIS — I779 Disorder of arteries and arterioles, unspecified: Secondary | ICD-10-CM

## 2013-11-22 DIAGNOSIS — I2510 Atherosclerotic heart disease of native coronary artery without angina pectoris: Secondary | ICD-10-CM

## 2013-11-22 MED ORDER — AMLODIPINE BESYLATE 5 MG PO TABS: ORAL_TABLET | ORAL | Status: AC

## 2013-11-22 NOTE — Progress Notes (Signed)
Patient ID: Linda [name], female   DOB: 1940/12/22, 73 y.o.   MRN: [id number]      SUBJECTIVE: Linda [name] is a very pleasant 73 year old female with a history of coronary artery disease status post coronary bypass grafting x3 in 2002 s/p PCI of the ostium of the SVG to the OM in 2003 and PCI with DES to the SVG to OM in July 2014. She also has a history of hypertension, hyperlipidemia, bradycardia, peripheral artery disease status post stent to the right common iliac artery in 2012, TIA, renal artery stenosis, and carotid artery disease. She was recently hospitalized for anginal pain and underwent coronary angiography on 11/07/2013 which demonstrated the following:  1. Three-vessel coronary artery disease with moderate stenosis of the distal left mainstem, continued patency of the LAD stented segment, total occlusion of the left circumflex, and total occlusion of the right coronary artery.  2. Continued patency of the saphenous vein graft RCA and saphenous vein graft to obtuse marginal  3. 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 Negative pressure wire analysis of the left mainstem.  5. Normal LV function with normal LVEDP  Medical therapy was recommended. She had some bruising and swelling at the left radial cath site and went to the emergency department for evaluation. She had normal pulses and function of her hand and was deemed stable for discharge. Hydrochlorothiazide and amlodipine were initially held at discharge due to low normal blood pressure. However, due to it gradually increasing after her hospital discharge, amlodipine was resumed.  She has been monitoring her blood pressure at home and it is continued to remain anywhere from 140-160 mmHg systolic. She does have a history of fluctuating blood pressures though. She denies any chest pain, leg swelling and shortness of breath. She continues to have bruising of her left forearm but says the swelling has resolved.   Review of Systems: As  per "subjective", otherwise negative.  Allergies  Allergen Reactions  . Sulfonamide Derivatives Rash and Other (See Comments)    Reaction: fever and rash    Current Outpatient Prescriptions  Medication Sig Dispense Refill  . amLODipine (NORVASC) 5 MG tablet Take 1 tablet (5 mg total) by mouth daily.  180 tablet  3  . aspirin 81 MG tablet Take 81 mg by mouth daily.        Marland Kitchen atorvastatin (LIPITOR) 40 MG tablet Take 40 mg by mouth daily.      . clopidogrel (PLAVIX) 75 MG tablet Take 75 mg by mouth daily.      Marland Kitchen docusate sodium (COLACE) 100 MG capsule Take 200 mg by mouth at bedtime.       . gabapentin (NEURONTIN) 100 MG capsule Take 100 mg by mouth at bedtime.      . isosorbide mononitrate (IMDUR) 60 MG 24 hr tablet Take 60 mg by mouth daily.       . metoprolol (TOPROL-XL) 50 MG 24 hr tablet Take 75 mg by mouth daily.       . nitroGLYCERIN (NITROSTAT) 0.4 MG SL tablet Place 1 tablet (0.4 mg total) under the tongue every 5 (five) minutes as needed. For chest pain  25 tablet  12  . olmesartan (BENICAR) 40 MG tablet Take 40 mg by mouth daily.      . Omega-3 Fatty Acids (FISH OIL) 1000 MG CAPS Take 2 capsules (2,000 mg total) by mouth 2 (two) times daily.       No current facility-administered medications for this visit.  Past Medical History  Diagnosis Date  . Hypertension     Unspecified   . Hyperlipidemia, mixed   . CAD (coronary artery disease)     a. s/p CABG x3v (2002); NSTEMI s/p PCI of the ostium of SVG to OM (2003); PCI with DES of SVG to OM (10/2012). b. Cath 11/07/13 - for medical therapy. Negative FFR of LM, see cath report otherwise.  . Bradycardia   . PAD (peripheral artery disease)     a. s/p stent to right common iliac artery (2012)  . TIA (transient ischemic attack) 1990's X 2-3  . Claudication   . Renal artery stenosis     95% stenosis  known from the past., Small left kidney, medical therapy  . Carotid artery disease     November, 2013  //   Doppler, March, 2014,  stable, 0-39% bilateral, followup 2 years  . Shoulder pain, left     Past Surgical History  Procedure Laterality Date  . Coronary artery bypass graft  03/2001    "CABG X3"   . Cardiac catheterization      "several; last one was 04/18/2013" (09/14/2013)  . Coronary angioplasty with stent placement      "counting the 2 they put in 10/2012 I've got 6 or 7 stents total"  . Knee surgery Left ~ 2004    "shattered; left me w/ 1/2 of a kneecap"   . Tonsillectomy and adenoidectomy  ~ 1948  . Cataract extraction w/ intraocular lens  implant, bilateral Bilateral 2015  . Iliac artery stent Right 2012  . Dilation and curettage of uterus  1964    S/P miscarriage    History   Social History  . Marital Status: Married    Spouse Name: N/A    Number of Children: N/A  . Years of Education: N/A   Occupational History  . Retired    Social History Main Topics  . Smoking status: Former Smoker -- 1.00 packs/day for 35 years    Types: Cigarettes    Quit date: 03/30/2001  . Smokeless tobacco: Former Neurosurgeon    Types: Snuff  . Alcohol Use: No  . Drug Use: No  . Sexual Activity: No   Other Topics Concern  . Not on file   Social History Narrative   She lives is [place] with husband and has two biological children.      BP 164/78  Pulse 58   PHYSICAL EXAM General: NAD Neck: No JVD, no thyromegaly. Lungs: Clear to auscultation bilaterally with normal respiratory effort. CV: Nondisplaced PMI.  Regular rate and rhythm, normal S1/S2, no S3/S4, no murmur. No pretibial or periankle edema.  No carotid bruit.  Normal pedal pulses.  Abdomen: Soft, nontender, no hepatosplenomegaly, no distention.  Neurologic: Alert and oriented x 3.  Psych: Normal affect. Extremities: No clubbing or cyanosis. Bruising of left forearm, no swelling.  ECG: reviewed and available in electronic records.      ASSESSMENT AND PLAN: 1. CAD/CABG, and multivessel PCI: Symptomatically stable. Cath results noted above.  Continue current meds which include ASA, Plavix, Imdur 60 mg, Lipitor, and metoprolol.   2. HTN: Elevated today. Continue ARB. She would prefer to avoid HCTZ. I will increase amlodipine to 5 mg q am and 2.5 mg q pm.  3. Hyperlipidemia: LDL 60, HDL 47 on 09/15/13. Continue Lipitor 40 mg daily.   4. PVD/carotid artery disease: Stable. Continue ASA, statin, and Plavix.   5. Renal artery stenosis: 1-59% right RAS with no flow to  left kidney by renal artery duplex in 09/2013. Monitor.  Dispo: f/u with Dr. Myrtis SerKatz.  Linda [name], M.D., F.A.C.C.

## 2013-11-22 NOTE — Patient Instructions (Signed)
   Increase Norvasc to 5mg  every morning & 2.5mg  every evening Continue all other medications.   Follow up next month with Dr. Myrtis SerKatz

## 2014-01-04 ENCOUNTER — Ambulatory Visit: Payer: Medicare Other | Admitting: Cardiology

## 2014-04-04 ENCOUNTER — Encounter (HOSPITAL_COMMUNITY): Payer: Self-pay | Admitting: Cardiovascular Disease

## 2016-03-15 IMAGING — CR DG CHEST 2V
2 series · 2 of 2 positions shown · non-contrast
Comparison: DG CHEST 2 VIEW dated 04/17/2013; DG CHEST 1V PORT
dated 09/26/2004

CLINICAL DATA: Chest pain, history of CABG, COPD, and hypertension

EXAM:
CHEST  2 VIEW

[w chest pa]
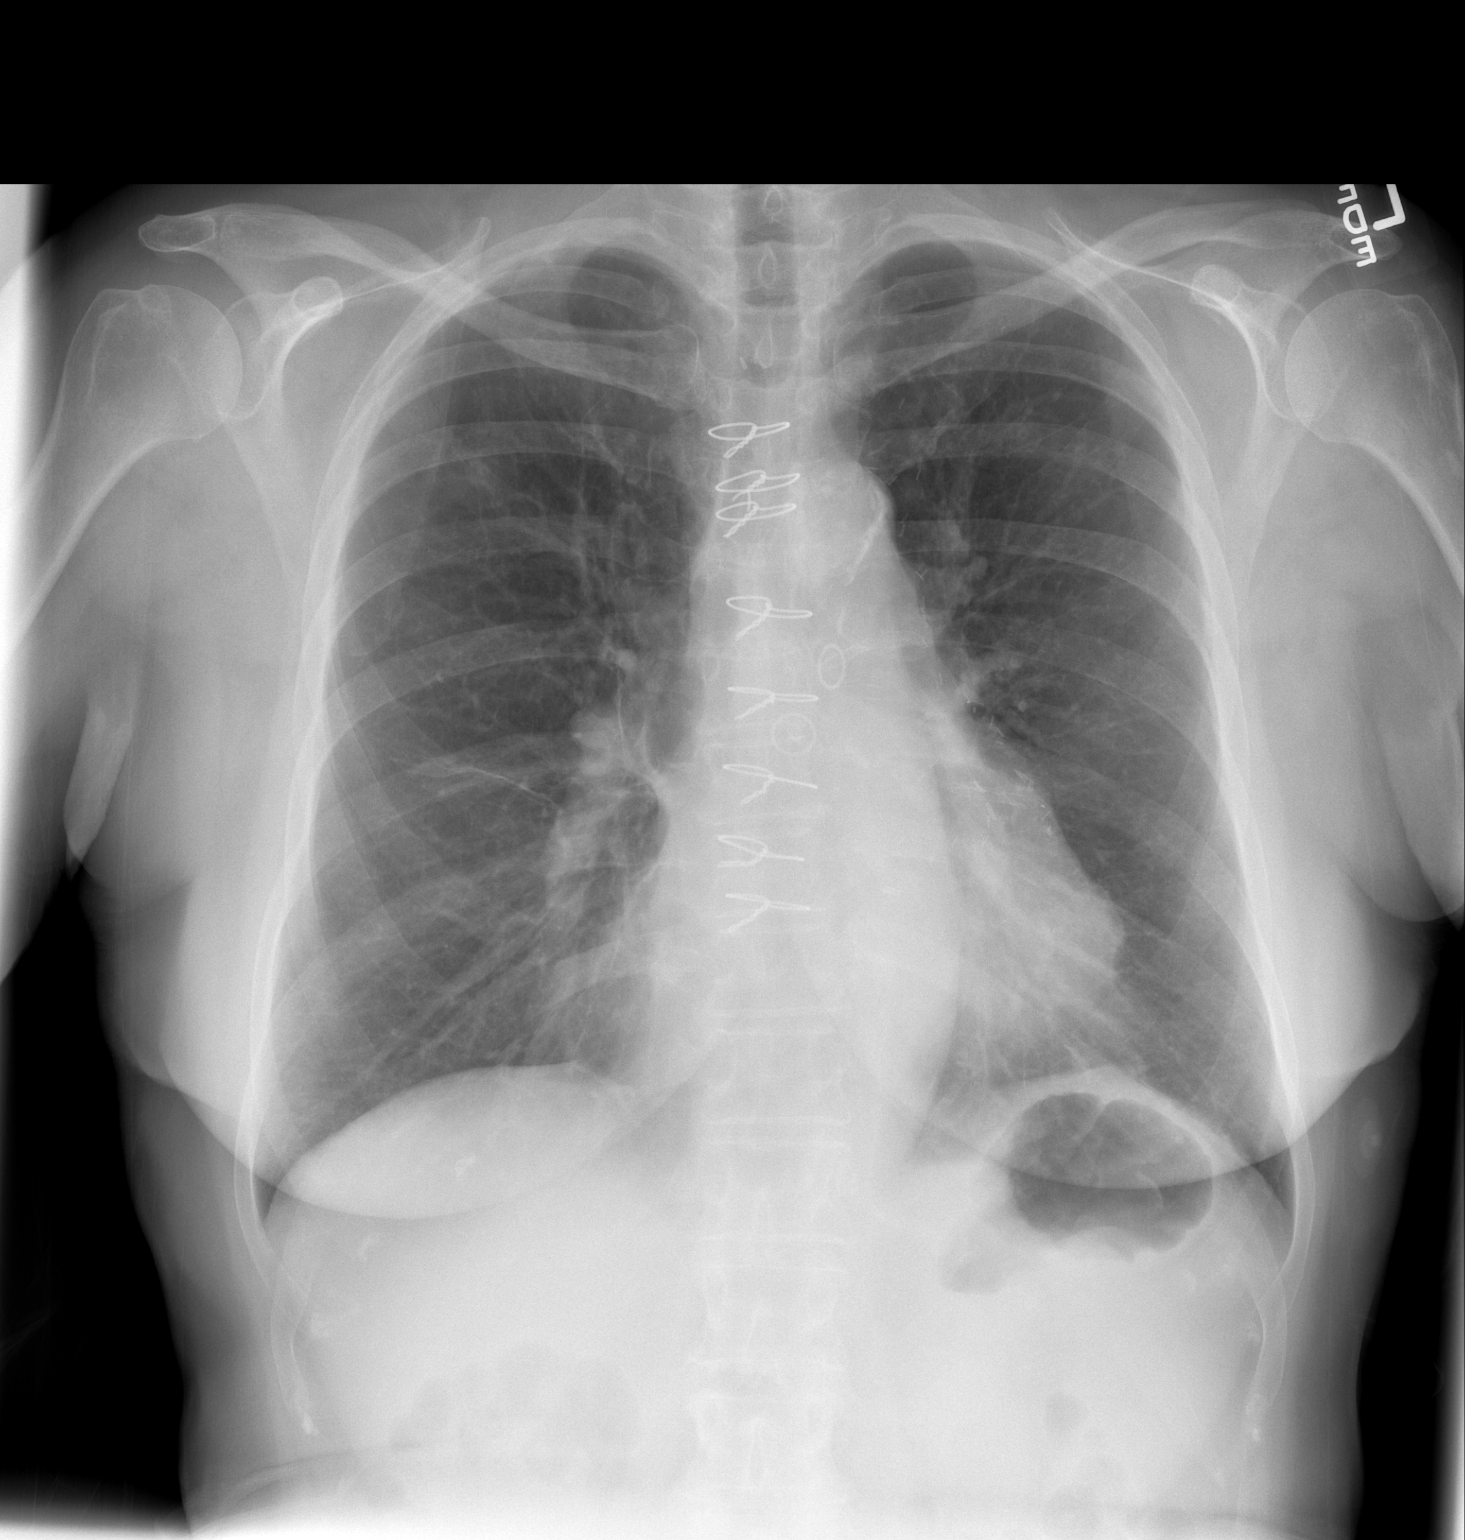

[w chest lat]
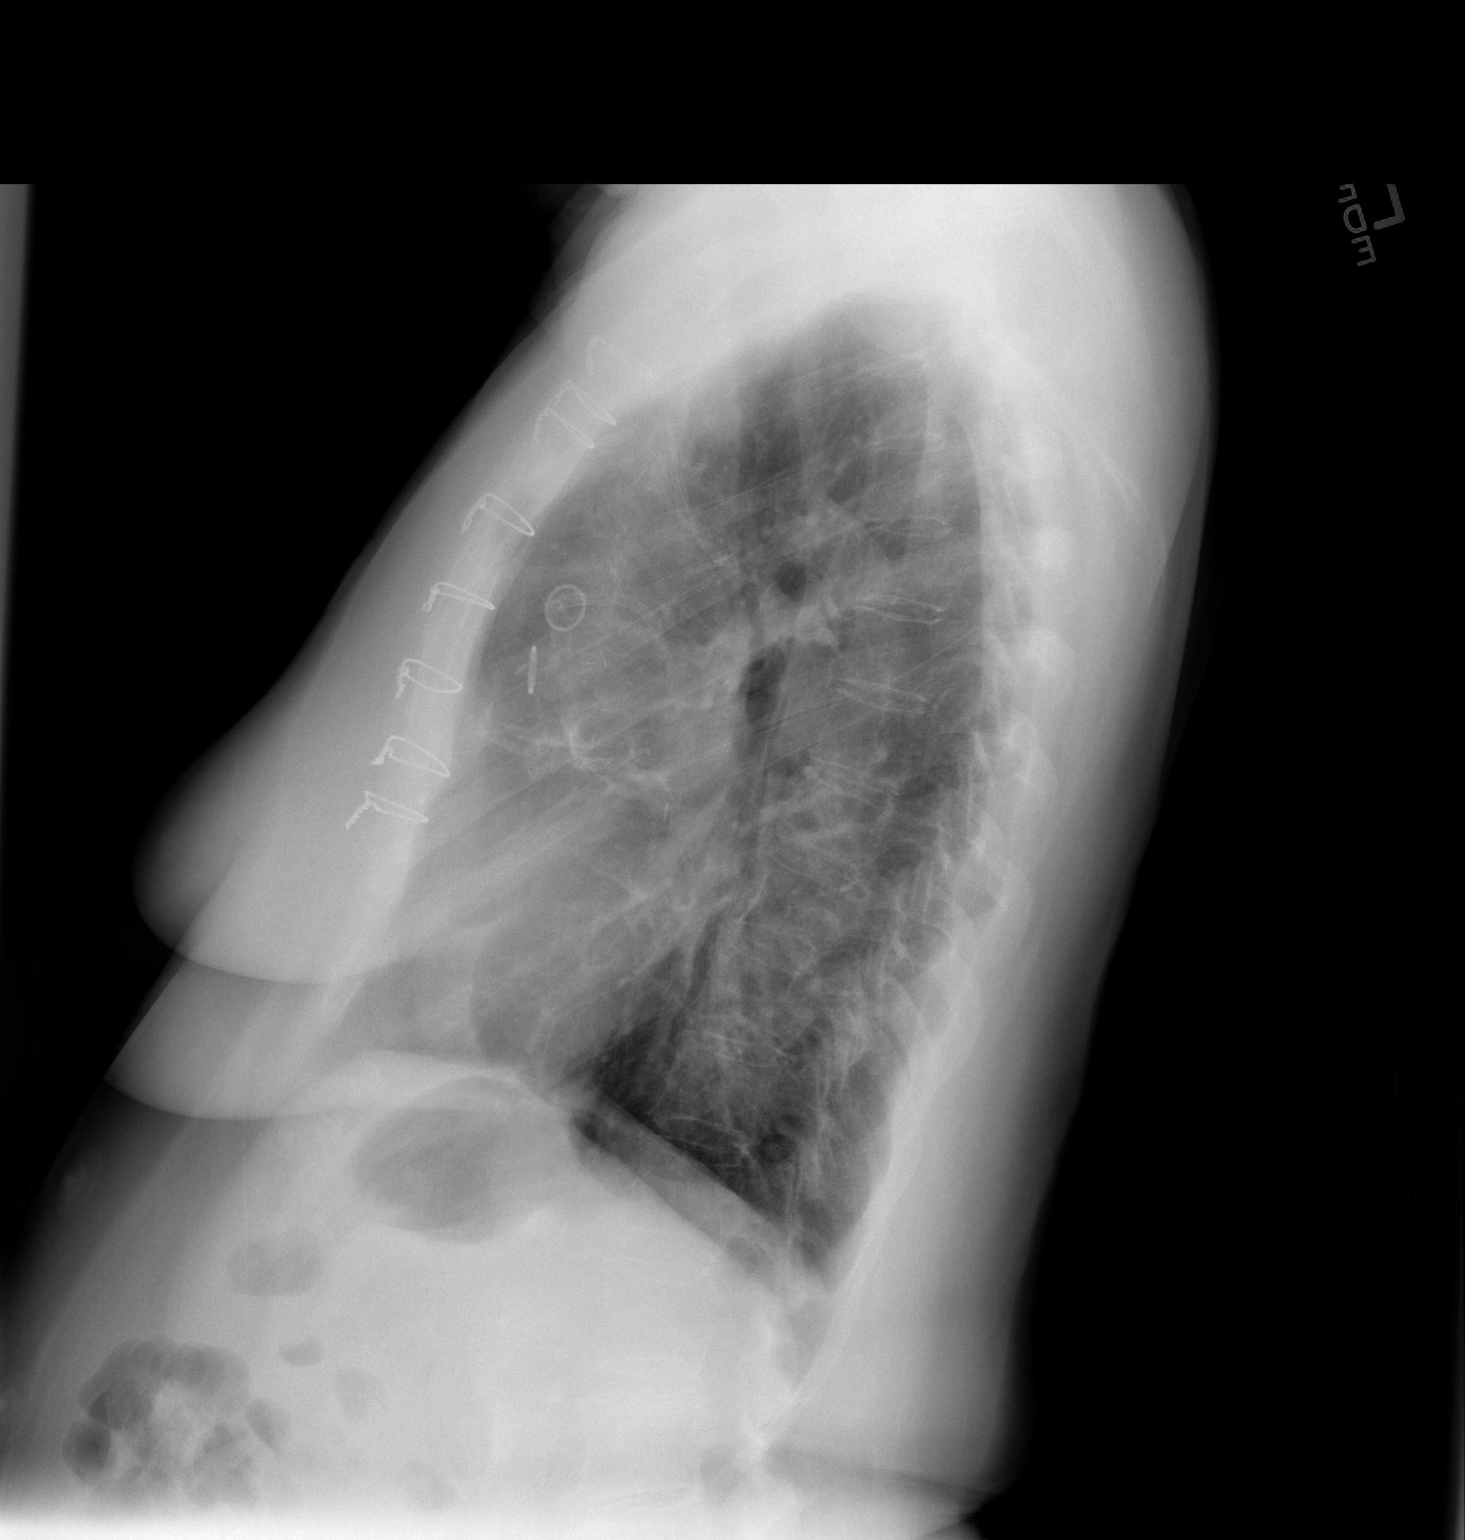

[2 of 2 positions shown; findings below may reference images not displayed]

FINDINGS: The heart size and mediastinal contours are within normal limits.
Post CABG changes. Tortuosity of the descending thoracic aorta.
Stable mild hyperinflation bilaterally with scarring in the right
mid lung. New subtle 5 mm nodule projecting over the anterior aspect
of right second rib. No pleural effusion or pneumothorax. Both lungs
are clear. The visualized skeletal structures are unremarkable.
IMPRESSION: Post CABG changes and stable scarring in the right mid lung. No CHF
nor pneumonia. Subtle nodule in the right upper lobe not clearly
related to skeletal structures merits further evaluation with
noncontrast CT scanning now.
